# Patient Record
Sex: Female | Born: 1960 | Race: White | Hispanic: No | Marital: Married | Smoking: Current some day smoker
Health system: Southern US, Community
[De-identification: ages and names within clinical notes are randomized; demographics above are authoritative.]

## PROBLEM LIST (undated history)

## (undated) DIAGNOSIS — N939 Abnormal uterine and vaginal bleeding, unspecified: Secondary | ICD-10-CM

## (undated) DIAGNOSIS — M1990 Unspecified osteoarthritis, unspecified site: Secondary | ICD-10-CM

## (undated) DIAGNOSIS — Z9889 Other specified postprocedural states: Secondary | ICD-10-CM

## (undated) DIAGNOSIS — E785 Hyperlipidemia, unspecified: Secondary | ICD-10-CM

## (undated) DIAGNOSIS — S2220XA Unspecified fracture of sternum, initial encounter for closed fracture: Secondary | ICD-10-CM

## (undated) DIAGNOSIS — Z8489 Family history of other specified conditions: Secondary | ICD-10-CM

## (undated) DIAGNOSIS — M4850XA Collapsed vertebra, not elsewhere classified, site unspecified, initial encounter for fracture: Secondary | ICD-10-CM

## (undated) DIAGNOSIS — Z8619 Personal history of other infectious and parasitic diseases: Secondary | ICD-10-CM

## (undated) DIAGNOSIS — J939 Pneumothorax, unspecified: Secondary | ICD-10-CM

## (undated) DIAGNOSIS — R112 Nausea with vomiting, unspecified: Secondary | ICD-10-CM

## (undated) DIAGNOSIS — S42009A Fracture of unspecified part of unspecified clavicle, initial encounter for closed fracture: Secondary | ICD-10-CM

## (undated) HISTORY — DX: Hyperlipidemia, unspecified: E78.5

## (undated) HISTORY — DX: Personal history of other infectious and parasitic diseases: Z86.19

## (undated) HISTORY — PX: TUBAL LIGATION: SHX77

## (undated) HISTORY — PX: KNEE ARTHROSCOPY W/ ACL RECONSTRUCTION: SHX1858

## (undated) HISTORY — DX: Abnormal uterine and vaginal bleeding, unspecified: N93.9

---

## 2003-03-02 HISTORY — PX: BREAST BIOPSY: SHX20

## 2010-08-27 ENCOUNTER — Other Ambulatory Visit (HOSPITAL_COMMUNITY)
Admission: RE | Admit: 2010-08-27 | Discharge: 2010-08-27 | Disposition: A | Discharge status: Home / Self Care | Payer: BC Managed Care – PPO | Source: Ambulatory Visit | Attending: Internal Medicine | Admitting: Internal Medicine

## 2010-08-27 ENCOUNTER — Other Ambulatory Visit: Payer: Self-pay | Admitting: Family Medicine

## 2010-08-27 DIAGNOSIS — Z1231 Encounter for screening mammogram for malignant neoplasm of breast: Secondary | ICD-10-CM

## 2010-08-27 DIAGNOSIS — Z01419 Encounter for gynecological examination (general) (routine) without abnormal findings: Secondary | ICD-10-CM | POA: Insufficient documentation

## 2010-10-01 ENCOUNTER — Ambulatory Visit
Admission: RE | Admit: 2010-10-01 | Discharge: 2010-10-01 | Disposition: A | Discharge status: Home / Self Care | Payer: BC Managed Care – PPO | Source: Ambulatory Visit | Attending: Family Medicine | Admitting: Family Medicine

## 2010-10-01 DIAGNOSIS — Z1231 Encounter for screening mammogram for malignant neoplasm of breast: Secondary | ICD-10-CM

## 2010-10-02 ENCOUNTER — Other Ambulatory Visit: Payer: Self-pay | Admitting: Family Medicine

## 2010-10-02 DIAGNOSIS — N644 Mastodynia: Secondary | ICD-10-CM

## 2010-10-20 ENCOUNTER — Ambulatory Visit
Admission: RE | Admit: 2010-10-20 | Discharge: 2010-10-20 | Disposition: A | Discharge status: Home / Self Care | Payer: BC Managed Care – PPO | Source: Ambulatory Visit | Attending: Family Medicine | Admitting: Family Medicine

## 2010-10-20 ENCOUNTER — Other Ambulatory Visit: Payer: Self-pay | Admitting: Family Medicine

## 2010-10-20 DIAGNOSIS — N644 Mastodynia: Secondary | ICD-10-CM

## 2014-05-08 ENCOUNTER — Encounter (HOSPITAL_COMMUNITY): Payer: Self-pay | Admitting: *Deleted

## 2014-05-09 ENCOUNTER — Ambulatory Visit (HOSPITAL_COMMUNITY): Payer: BLUE CROSS/BLUE SHIELD

## 2014-05-09 ENCOUNTER — Encounter (HOSPITAL_COMMUNITY): Payer: Self-pay | Admitting: *Deleted

## 2014-05-09 ENCOUNTER — Ambulatory Visit (HOSPITAL_COMMUNITY)
Admission: RE | Admit: 2014-05-09 | Discharge: 2014-05-09 | Disposition: A | Discharge status: Home / Self Care | Payer: BLUE CROSS/BLUE SHIELD | Source: Ambulatory Visit | Attending: Orthopedic Surgery | Admitting: Orthopedic Surgery

## 2014-05-09 ENCOUNTER — Ambulatory Visit (HOSPITAL_COMMUNITY): Payer: BLUE CROSS/BLUE SHIELD | Admitting: Certified Registered"

## 2014-05-09 ENCOUNTER — Encounter (HOSPITAL_COMMUNITY)
Admission: RE | Disposition: A | Discharge status: Home / Self Care | Payer: Self-pay | Source: Ambulatory Visit | Attending: Orthopedic Surgery

## 2014-05-09 DIAGNOSIS — Z886 Allergy status to analgesic agent status: Secondary | ICD-10-CM | POA: Diagnosis not present

## 2014-05-09 DIAGNOSIS — Z87891 Personal history of nicotine dependence: Secondary | ICD-10-CM | POA: Insufficient documentation

## 2014-05-09 DIAGNOSIS — S42001A Fracture of unspecified part of right clavicle, initial encounter for closed fracture: Secondary | ICD-10-CM | POA: Diagnosis not present

## 2014-05-09 DIAGNOSIS — S42009A Fracture of unspecified part of unspecified clavicle, initial encounter for closed fracture: Secondary | ICD-10-CM

## 2014-05-09 HISTORY — PX: ORIF CLAVICULAR FRACTURE: SHX5055

## 2014-05-09 HISTORY — DX: Unspecified fracture of sternum, initial encounter for closed fracture: S22.20XA

## 2014-05-09 HISTORY — DX: Family history of other specified conditions: Z84.89

## 2014-05-09 HISTORY — DX: Pneumothorax, unspecified: J93.9

## 2014-05-09 HISTORY — DX: Nausea with vomiting, unspecified: R11.2

## 2014-05-09 HISTORY — DX: Unspecified osteoarthritis, unspecified site: M19.90

## 2014-05-09 HISTORY — DX: Fracture of unspecified part of unspecified clavicle, initial encounter for closed fracture: S42.009A

## 2014-05-09 HISTORY — DX: Other specified postprocedural states: Z98.890

## 2014-05-09 HISTORY — DX: Collapsed vertebra, not elsewhere classified, site unspecified, initial encounter for fracture: M48.50XA

## 2014-05-09 LAB — PREGNANCY, URINE: Preg Test, Ur: NEGATIVE

## 2014-05-09 SURGERY — OPEN REDUCTION INTERNAL FIXATION (ORIF) CLAVICULAR FRACTURE
Anesthesia: General | Site: Shoulder | Laterality: Right

## 2014-05-09 MED ORDER — FENTANYL CITRATE 0.05 MG/ML IJ SOLN
25.0000 ug | INTRAMUSCULAR | Status: DC | PRN
  Administered 2014-05-09 (×2): 50 ug via INTRAVENOUS

## 2014-05-09 MED ORDER — METHOCARBAMOL 500 MG PO TABS
ORAL_TABLET | ORAL | Status: AC
  Filled 2014-05-09: qty 1

## 2014-05-09 MED ORDER — HYDROMORPHONE HCL 1 MG/ML IJ SOLN
INTRAMUSCULAR | Status: AC
  Administered 2014-05-09: 0.5 mg via INTRAVENOUS
  Filled 2014-05-09: qty 1

## 2014-05-09 MED ORDER — METHOCARBAMOL 500 MG PO TABS: 500.0000 mg | ORAL_TABLET | Freq: Three times a day (TID) | ORAL | Status: DC | PRN

## 2014-05-09 MED ORDER — MIDAZOLAM HCL 2 MG/2ML IJ SOLN
INTRAMUSCULAR | Status: AC
  Filled 2014-05-09: qty 2

## 2014-05-09 MED ORDER — KETOROLAC TROMETHAMINE 30 MG/ML IJ SOLN
30.0000 mg | Freq: Once | INTRAMUSCULAR | Status: AC | PRN
  Administered 2014-05-09: 30 mg via INTRAVENOUS

## 2014-05-09 MED ORDER — LIDOCAINE HCL (CARDIAC) 20 MG/ML IV SOLN
INTRAVENOUS | Status: DC | PRN
  Administered 2014-05-09: 50 mg via INTRAVENOUS

## 2014-05-09 MED ORDER — HYDROMORPHONE HCL 1 MG/ML IJ SOLN
INTRAMUSCULAR | Status: AC
  Administered 2014-05-09: 0.5 mg via INTRAVENOUS
  Filled 2014-05-09: qty 2

## 2014-05-09 MED ORDER — MIDAZOLAM HCL 5 MG/5ML IJ SOLN
INTRAMUSCULAR | Status: DC | PRN
  Administered 2014-05-09: 2 mg via INTRAVENOUS

## 2014-05-09 MED ORDER — GLYCOPYRROLATE 0.2 MG/ML IJ SOLN
INTRAMUSCULAR | Status: DC | PRN
  Administered 2014-05-09: 0.2 mg via INTRAVENOUS

## 2014-05-09 MED ORDER — ROCURONIUM BROMIDE 50 MG/5ML IV SOLN
INTRAVENOUS | Status: AC
  Filled 2014-05-09: qty 1

## 2014-05-09 MED ORDER — 0.9 % SODIUM CHLORIDE (POUR BTL) OPTIME
TOPICAL | Status: DC | PRN
  Administered 2014-05-09: 1000 mL

## 2014-05-09 MED ORDER — BUPIVACAINE HCL (PF) 0.25 % IJ SOLN
INTRAMUSCULAR | Status: AC
  Filled 2014-05-09: qty 30

## 2014-05-09 MED ORDER — HYDROMORPHONE HCL 1 MG/ML IJ SOLN
0.2500 mg | INTRAMUSCULAR | Status: DC | PRN
  Administered 2014-05-09 (×4): 0.5 mg via INTRAVENOUS

## 2014-05-09 MED ORDER — HYDROMORPHONE HCL 1 MG/ML IJ SOLN
INTRAMUSCULAR | Status: AC
  Filled 2014-05-09: qty 1

## 2014-05-09 MED ORDER — LACTATED RINGERS IV SOLN
INTRAVENOUS | Status: DC
  Administered 2014-05-09 (×2): via INTRAVENOUS

## 2014-05-09 MED ORDER — METHOCARBAMOL 500 MG PO TABS
500.0000 mg | ORAL_TABLET | Freq: Once | ORAL | Status: AC
  Administered 2014-05-09: 500 mg via ORAL

## 2014-05-09 MED ORDER — FENTANYL CITRATE 0.05 MG/ML IJ SOLN
INTRAMUSCULAR | Status: AC
  Filled 2014-05-09: qty 5

## 2014-05-09 MED ORDER — ONDANSETRON HCL 4 MG/2ML IJ SOLN
INTRAMUSCULAR | Status: DC | PRN
  Administered 2014-05-09: 4 mg via INTRAVENOUS

## 2014-05-09 MED ORDER — CHLORHEXIDINE GLUCONATE 4 % EX LIQD: 60.0000 mL | Freq: Once | CUTANEOUS | Status: DC

## 2014-05-09 MED ORDER — LIDOCAINE HCL (CARDIAC) 20 MG/ML IV SOLN
INTRAVENOUS | Status: AC
  Filled 2014-05-09: qty 5

## 2014-05-09 MED ORDER — BUPIVACAINE-EPINEPHRINE (PF) 0.25% -1:200000 IJ SOLN
INTRAMUSCULAR | Status: AC
  Filled 2014-05-09: qty 30

## 2014-05-09 MED ORDER — BUPIVACAINE-EPINEPHRINE 0.25% -1:200000 IJ SOLN
INTRAMUSCULAR | Status: DC | PRN
  Administered 2014-05-09: 7 mL

## 2014-05-09 MED ORDER — FENTANYL CITRATE 0.05 MG/ML IJ SOLN
INTRAMUSCULAR | Status: DC | PRN
  Administered 2014-05-09: 25 ug via INTRAVENOUS
  Administered 2014-05-09: 50 ug via INTRAVENOUS
  Administered 2014-05-09: 25 ug via INTRAVENOUS
  Administered 2014-05-09: 50 ug via INTRAVENOUS
  Administered 2014-05-09: 25 ug via INTRAVENOUS
  Administered 2014-05-09: 100 ug via INTRAVENOUS
  Administered 2014-05-09 (×3): 25 ug via INTRAVENOUS

## 2014-05-09 MED ORDER — GLYCOPYRROLATE 0.2 MG/ML IJ SOLN
INTRAMUSCULAR | Status: AC
  Filled 2014-05-09: qty 1

## 2014-05-09 MED ORDER — FENTANYL CITRATE 0.05 MG/ML IJ SOLN
INTRAMUSCULAR | Status: AC
  Administered 2014-05-09: 50 ug via INTRAVENOUS
  Filled 2014-05-09: qty 2

## 2014-05-09 MED ORDER — DEXAMETHASONE SODIUM PHOSPHATE 4 MG/ML IJ SOLN
INTRAMUSCULAR | Status: DC | PRN
  Administered 2014-05-09: 8 mg via INTRAVENOUS

## 2014-05-09 MED ORDER — DEXAMETHASONE SODIUM PHOSPHATE 4 MG/ML IJ SOLN
INTRAMUSCULAR | Status: AC
  Filled 2014-05-09: qty 1

## 2014-05-09 MED ORDER — PROPOFOL 10 MG/ML IV BOLUS
INTRAVENOUS | Status: AC
  Filled 2014-05-09: qty 20

## 2014-05-09 MED ORDER — SUCCINYLCHOLINE CHLORIDE 20 MG/ML IJ SOLN
INTRAMUSCULAR | Status: DC | PRN
  Administered 2014-05-09: 100 mg via INTRAVENOUS

## 2014-05-09 MED ORDER — PROMETHAZINE HCL 25 MG/ML IJ SOLN: 6.2500 mg | INTRAMUSCULAR | Status: DC | PRN

## 2014-05-09 MED ORDER — PROPOFOL 10 MG/ML IV BOLUS
INTRAVENOUS | Status: DC | PRN
  Administered 2014-05-09: 150 mg via INTRAVENOUS

## 2014-05-09 MED ORDER — KETOROLAC TROMETHAMINE 30 MG/ML IJ SOLN
INTRAMUSCULAR | Status: AC
  Administered 2014-05-09: 30 mg via INTRAVENOUS
  Filled 2014-05-09: qty 1

## 2014-05-09 MED ORDER — PHENYLEPHRINE HCL 10 MG/ML IJ SOLN
10.0000 mg | INTRAVENOUS | Status: DC | PRN
  Administered 2014-05-09: 20 ug/min via INTRAVENOUS

## 2014-05-09 MED ORDER — CEFAZOLIN SODIUM-DEXTROSE 2-3 GM-% IV SOLR
2.0000 g | INTRAVENOUS | Status: AC
  Administered 2014-05-09: 2 g via INTRAVENOUS
  Filled 2014-05-09: qty 50

## 2014-05-09 SURGICAL SUPPLY — 55 items
BIT DRILL 3.2 QC DISP (BIT) ×3 IMPLANT
BIT DRILL Q COUPLING 4.5 (BIT) IMPLANT
BIT DRILL Q/COUPLING 1 (BIT) IMPLANT
CLEANER TIP ELECTROSURG 2X2 (MISCELLANEOUS) ×3 IMPLANT
CLOSURE WOUND 1/2 X4 (GAUZE/BANDAGES/DRESSINGS) ×1
DRAPE C-ARM 42X72 X-RAY (DRAPES) ×3 IMPLANT
DRAPE IMP U-DRAPE 54X76 (DRAPES) ×3 IMPLANT
DRAPE INCISE IOBAN 66X45 STRL (DRAPES) ×3 IMPLANT
DRAPE U-SHAPE 47X51 STRL (DRAPES) ×3 IMPLANT
DRSG EMULSION OIL 3X3 NADH (GAUZE/BANDAGES/DRESSINGS) ×3 IMPLANT
DRSG PAD ABDOMINAL 8X10 ST (GAUZE/BANDAGES/DRESSINGS) ×3 IMPLANT
DURAPREP 26ML APPLICATOR (WOUND CARE) ×3 IMPLANT
ELECT NEEDLE TIP 2.8 STRL (NEEDLE) ×3 IMPLANT
ELECT REM PT RETURN 9FT ADLT (ELECTROSURGICAL) ×3
ELECTRODE REM PT RTRN 9FT ADLT (ELECTROSURGICAL) ×1 IMPLANT
GAUZE SPONGE 4X4 12PLY STRL (GAUZE/BANDAGES/DRESSINGS) ×3 IMPLANT
GLOVE BIOGEL PI IND STRL 7.5 (GLOVE) ×2 IMPLANT
GLOVE BIOGEL PI INDICATOR 7.5 (GLOVE) ×4
GLOVE BIOGEL PI ORTHO PRO 7.5 (GLOVE) ×2
GLOVE BIOGEL PI ORTHO PRO SZ8 (GLOVE) ×2
GLOVE ORTHO TXT STRL SZ7.5 (GLOVE) ×3 IMPLANT
GLOVE PI ORTHO PRO STRL 7.5 (GLOVE) ×1 IMPLANT
GLOVE PI ORTHO PRO STRL SZ8 (GLOVE) ×1 IMPLANT
GLOVE SKINSENSE NS SZ7.5 (GLOVE) ×2
GLOVE SKINSENSE STRL SZ7.5 (GLOVE) ×1 IMPLANT
GLOVE SURG ORTHO 8.5 STRL (GLOVE) ×6 IMPLANT
GOWN STRL REUS W/ TWL LRG LVL3 (GOWN DISPOSABLE) ×2 IMPLANT
GOWN STRL REUS W/ TWL XL LVL3 (GOWN DISPOSABLE) ×2 IMPLANT
GOWN STRL REUS W/TWL LRG LVL3 (GOWN DISPOSABLE) ×4
GOWN STRL REUS W/TWL XL LVL3 (GOWN DISPOSABLE) ×4
KIT BASIN OR (CUSTOM PROCEDURE TRAY) ×3 IMPLANT
KIT ROOM TURNOVER OR (KITS) ×3 IMPLANT
MANIFOLD NEPTUNE II (INSTRUMENTS) IMPLANT
NEEDLE HYPO 25GX1X1/2 BEV (NEEDLE) ×3 IMPLANT
NS IRRIG 1000ML POUR BTL (IV SOLUTION) ×3 IMPLANT
PACK SHOULDER (CUSTOM PROCEDURE TRAY) ×3 IMPLANT
PACK UNIVERSAL I (CUSTOM PROCEDURE TRAY) ×3 IMPLANT
PAD ARMBOARD 7.5X6 YLW CONV (MISCELLANEOUS) ×6 IMPLANT
PIN CLAVICLE ASSEMBLY 3.0MM (Pin) ×3 IMPLANT
SLING ARM IMMOBILIZER LRG (SOFTGOODS) ×3 IMPLANT
SLING ARM LRG ADULT FOAM STRAP (SOFTGOODS) ×3 IMPLANT
SPONGE LAP 4X18 X RAY DECT (DISPOSABLE) ×3 IMPLANT
STRIP CLOSURE SKIN 1/2X4 (GAUZE/BANDAGES/DRESSINGS) ×2 IMPLANT
SUCTION FRAZIER TIP 10 FR DISP (SUCTIONS) ×3 IMPLANT
SUT FIBERWIRE #2 38 T-5 BLUE (SUTURE) ×6
SUT MNCRL AB 4-0 PS2 18 (SUTURE) ×3 IMPLANT
SUT VIC AB 2-0 CT1 27 (SUTURE) ×4
SUT VIC AB 2-0 CT1 TAPERPNT 27 (SUTURE) ×2 IMPLANT
SUT VICRYL 0 CT 1 36IN (SUTURE) ×6 IMPLANT
SUTURE FIBERWR #2 38 T-5 BLUE (SUTURE) ×2 IMPLANT
SYR CONTROL 10ML LL (SYRINGE) ×3 IMPLANT
TAPE CLOTH SURG 6X10 WHT LF (GAUZE/BANDAGES/DRESSINGS) ×3 IMPLANT
TOWEL OR 17X24 6PK STRL BLUE (TOWEL DISPOSABLE) ×3 IMPLANT
TOWEL OR 17X26 10 PK STRL BLUE (TOWEL DISPOSABLE) ×3 IMPLANT
WATER STERILE IRR 1000ML POUR (IV SOLUTION) ×3 IMPLANT

## 2014-05-09 NOTE — Interval H&P Note (Signed)
History and Physical Interval Note:  05/09/2014 8:19 PM  Madison Watts  has presented today for surgery, with the diagnosis of right clavicle fracture  The various methods of treatment have been discussed with the patient and family. After consideration of risks, benefits and other options for treatment, the patient has consented to  Procedure(s): OPEN REDUCTION INTERNAL FIXATION (ORIF) RIGHT CLAVICULAR FRACTURE (Right) as a surgical intervention .  The patient's history has been reviewed, patient examined, no change in status, stable for surgery.  I have reviewed the patient's chart and labs.  Questions were answered to the patient's satisfaction.     Juliauna Stueve,STEVEN R
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
## 2014-05-09 NOTE — Transfer of Care (Signed)
Immediate Anesthesia Transfer of Care Note  Patient: Madison Watts  Procedure(s) Performed: Procedure(s): OPEN REDUCTION INTERNAL FIXATION (ORIF) RIGHT CLAVICULAR FRACTURE (Right)  Patient Location: PACU  Anesthesia Type:General  Level of Consciousness: awake, alert , oriented and patient cooperative  Airway & Oxygen Therapy: Patient Spontanous Breathing and Patient connected to nasal cannula oxygen  Post-op Assessment: Report given to RN, Post -op Vital signs reviewed and stable and Patient moving all extremities  Post vital signs: Reviewed and stable  Last Vitals:  Filed Vitals:   05/09/14 1706  BP: 158/87  Pulse: 78  Temp: 36.6 C  Resp: 18    Complications: No apparent anesthesia complications

## 2014-05-09 NOTE — Anesthesia Preprocedure Evaluation (Addendum)
Anesthesia Evaluation  Patient identified by MRN, date of birth, ID band Patient awake    Reviewed: Allergy & Precautions, NPO status , Patient's Chart, lab work & pertinent test results  History of Anesthesia Complications (+) PONV  Airway Mallampati: II  TM Distance: >3 FB Neck ROM: Full    Dental no notable dental hx.    Pulmonary neg pulmonary ROS, former smoker,  Patient has small pneumothorax breath sounds clear to auscultation  Pulmonary exam normal       Cardiovascular negative cardio ROS  Rhythm:Regular Rate:Normal     Neuro/Psych negative neurological ROS  negative psych ROS   GI/Hepatic negative GI ROS, Neg liver ROS,   Endo/Other  negative endocrine ROS  Renal/GU negative Renal ROS  negative genitourinary   Musculoskeletal negative musculoskeletal ROS (+)   Abdominal   Peds negative pediatric ROS (+)  Hematology negative hematology ROS (+)   Anesthesia Other Findings   Reproductive/Obstetrics negative OB ROS                           Anesthesia Physical Anesthesia Plan  ASA: II  Anesthesia Plan: General   Post-op Pain Management:    Induction: Intravenous  Airway Management Planned: Oral ETT  Additional Equipment:   Intra-op Plan:   Post-operative Plan: Extubation in OR  Informed Consent: I have reviewed the patients History and Physical, chart, labs and discussed the procedure including the risks, benefits and alternatives for the proposed anesthesia with the patient or authorized representative who has indicated his/her understanding and acceptance.   Dental advisory given  Plan Discussed with: CRNA and Surgeon  Anesthesia Plan Comments:        Anesthesia Quick Evaluation

## 2014-05-09 NOTE — Anesthesia Procedure Notes (Signed)
Procedure Name: Intubation Date/Time: 05/09/2014 8:26 PM Performed by: Eligha Bridegroom Pre-anesthesia Checklist: Emergency Drugs available, Patient identified, Timeout performed, Suction available and Patient being monitored Patient Re-evaluated:Patient Re-evaluated prior to inductionOxygen Delivery Method: Circle system utilized Preoxygenation: Pre-oxygenation with 100% oxygen Intubation Type: IV induction Ventilation: Mask ventilation without difficulty Laryngoscope Size: Mac and 4 Grade View: Grade I Tube type: Oral Tube size: 7.0 mm Number of attempts: 1 Airway Equipment and Method: Stylet Placement Confirmation: positive ETCO2,  ETT inserted through vocal cords under direct vision and breath sounds checked- equal and bilateral Secured at: 21 cm Tube secured with: Tape Dental Injury: Teeth and Oropharynx as per pre-operative assessment

## 2014-05-09 NOTE — Discharge Instructions (Signed)
Ice to the shoulder at all times. Wear the sling while up and around.  If seated and the arm supported, can remove the sling gently.  No use of the right arm.  Keep the dressing clean and dry and intact until next Monday, then can remove and place BandAids.  Ok to get wet in the shower next Thursday   General Anesthesia, Care After Refer to this sheet in the next few weeks. These instructions provide you with information on caring for yourself after your procedure. Your health care provider may also give you more specific instructions. Your treatment has been planned according to current medical practices, but problems sometimes occur. Call your health care provider if you have any problems or questions after your procedure. WHAT TO EXPECT AFTER THE PROCEDURE After the procedure, it is typical to experience:  Sleepiness.  Nausea and vomiting. HOME CARE INSTRUCTIONS  For the first 24 hours after general anesthesia:  Have a responsible person with you.  Do not drive a car. If you are alone, do not take public transportation.  Do not drink alcohol.  Do not take medicine that has not been prescribed by your health care provider.  Do not sign important papers or make important decisions.  You may resume a normal diet and activities as directed by your health care provider.  Change bandages (dressings) as directed.  If you have questions or problems that seem related to general anesthesia, call the hospital and ask for the anesthetist or anesthesiologist on call. SEEK MEDICAL CARE IF:  You have nausea and vomiting that continue the day after anesthesia.  You develop a rash. SEEK IMMEDIATE MEDICAL CARE IF:   You have difficulty breathing.  You have chest pain.  You have any allergic problems. Document Released: 05/24/2000 Document Revised: 02/20/2013 Document Reviewed: 08/31/2012 [facility] Patient Information 2015 [place], [place]. This information is not intended to replace  advice given to you by your health care provider. Make sure you discuss any questions you have with your health care provider.
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
## 2014-05-10 NOTE — Anesthesia Postprocedure Evaluation (Signed)
  Anesthesia Post-op Note  Patient: Madison Watts  Procedure(s) Performed: Procedure(s) (LRB): OPEN REDUCTION INTERNAL FIXATION (ORIF) RIGHT CLAVICULAR FRACTURE (Right)  Patient Location: PACU  Anesthesia Type: General  Level of Consciousness: awake and alert   Airway and Oxygen Therapy: Patient Spontanous Breathing  Post-op Pain: mild  Post-op Assessment: Post-op Vital signs reviewed, Patient's Cardiovascular Status Stable, Respiratory Function Stable, Patent Airway and No signs of Nausea or vomiting  Last Vitals:  Filed Vitals:   05/09/14 2300  BP: 168/89  Pulse: 96  Temp: 36.8 C  Resp: 14    Post-op Vital Signs: stable   Complications: No apparent anesthesia complications
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
## 2014-05-10 NOTE — Discharge Summary (Signed)
  Physician Discharge Summary   Patient ID: Madison Watts MRN: [id number] DOB/AGE: 1960/05/10 54 y.o.  Admit date: 05/09/2014 Discharge date: 05/10/2014  Admission Diagnoses:  Active Problems:   * No active hospital problems. *   Discharge Diagnoses:  Same   Surgeries: Procedure(s): OPEN REDUCTION INTERNAL FIXATION (ORIF) RIGHT CLAVICULAR FRACTURE on 05/09/2014   Consultants: none  Discharged Condition: Stable  Hospital Course: Madison Watts is an 54 y.o. female who was admitted 05/09/2014 with a chief complaint of right shoulder pain, and found to have a diagnosis of right displaced clavicle fracture.  They were brought to the operating room on 05/09/2014 and underwent the above named procedures.    The patient had an uncomplicated hospital course and was stable for discharge.  Recent vital signs:  Filed Vitals:   05/09/14 2300  BP: 168/89  Pulse: 96  Temp: 98.2 F (36.8 C)  Resp: 14    Recent laboratory studies:  Results for orders placed or performed during the hospital encounter of 05/09/14  Pregnancy, urine  Result Value Ref Range   Preg Test, Ur NEGATIVE NEGATIVE    Discharge Medications:     Medication List    TAKE these medications        methocarbamol 500 MG tablet  Commonly known as:  ROBAXIN  Take 1 tablet (500 mg total) by mouth 3 (three) times daily as needed.     OxyCODONE 10 mg T12a 12 hr tablet  Commonly known as:  OXYCONTIN  Take 10 mg by mouth 2 (two) times daily as needed (for pain).     sennosides-docusate sodium 8.6-50 MG tablet  Commonly known as:  SENOKOT-S  Take 2 tablets by mouth 2 (two) times daily.        Diagnostic Studies: Dg Clavicle Right  05/09/2014   CLINICAL DATA:  Right clavicle fracture ORIF.  EXAM: DG C-ARM 61-120 MIN; RIGHT CLAVICLE - 2+ VIEWS  COMPARISON:  None.  FLUOROSCOPY TIME:  C-arm fluoroscopic images were obtained intraoperatively and submitted for post operative interpretation. Please see the performing  provider's procedural report for the fluoroscopy time utilized.  FINDINGS: Two spot fluoroscopic images demonstrate screw fixation of the right clavicle. Underlying bone detail limited by technique and overlapping hardware. No apparent complication.  IMPRESSION: Intraoperative views during right clavicle fixation.   Electronically Signed   By: Richardean Sale M.D.   On: 05/09/2014 22:52   Dg C-arm 1-60 Min  05/09/2014   CLINICAL DATA:  Right clavicle fracture ORIF.  EXAM: DG C-ARM 61-120 MIN; RIGHT CLAVICLE - 2+ VIEWS  COMPARISON:  None.  FLUOROSCOPY TIME:  C-arm fluoroscopic images were obtained intraoperatively and submitted for post operative interpretation. Please see the performing provider's procedural report for the fluoroscopy time utilized.  FINDINGS: Two spot fluoroscopic images demonstrate screw fixation of the right clavicle. Underlying bone detail limited by technique and overlapping hardware. No apparent complication.  IMPRESSION: Intraoperative views during right clavicle fixation.   Electronically Signed   By: Richardean Sale M.D.   On: 05/09/2014 22:52    Disposition: 01-Home or Self Care        Follow-up Information    Follow up with Auren Valdes,STEVEN R, MD. Call in 2 weeks.   Specialty:  Orthopedic Surgery   Why:  [phone]   Contact information:   [address] [place] [address] [phone]        Signed: Augustin Schooling 05/10/2014, 1:44 AM

## 2014-05-12 ENCOUNTER — Encounter (HOSPITAL_COMMUNITY): Payer: Self-pay | Admitting: Orthopedic Surgery

## 2014-09-12 ENCOUNTER — Telehealth: Payer: Self-pay | Admitting: Family Medicine
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
## 2014-09-13 NOTE — Telephone Encounter (Signed)
Husband states pt has stepped out and pls call back Monday  Do you mind giving her a call on Monday? Thanks!

## 2014-09-13 NOTE — Telephone Encounter (Signed)
Ok. In available 30 minute slot if possible. Thanks.

## 2014-09-26 ENCOUNTER — Ambulatory Visit (INDEPENDENT_AMBULATORY_CARE_PROVIDER_SITE_OTHER): Payer: BLUE CROSS/BLUE SHIELD | Admitting: Family Medicine

## 2014-09-26 ENCOUNTER — Encounter: Payer: Self-pay | Admitting: Family Medicine

## 2014-09-26 VITALS — BP 120/88 | HR 83 | Temp 98.3°F | Ht 68.0 in | Wt 202.1 lb

## 2014-09-26 DIAGNOSIS — Z7689 Persons encountering health services in other specified circumstances: Secondary | ICD-10-CM

## 2014-09-26 DIAGNOSIS — E785 Hyperlipidemia, unspecified: Secondary | ICD-10-CM | POA: Insufficient documentation

## 2014-09-26 DIAGNOSIS — R918 Other nonspecific abnormal finding of lung field: Secondary | ICD-10-CM | POA: Insufficient documentation

## 2014-09-26 DIAGNOSIS — Z7189 Other specified counseling: Secondary | ICD-10-CM | POA: Diagnosis not present

## 2014-09-26 DIAGNOSIS — N9489 Other specified conditions associated with female genital organs and menstrual cycle: Secondary | ICD-10-CM | POA: Diagnosis not present

## 2014-09-26 DIAGNOSIS — N858 Other specified noninflammatory disorders of uterus: Secondary | ICD-10-CM | POA: Insufficient documentation

## 2014-09-26 DIAGNOSIS — IMO0001 Reserved for inherently not codable concepts without codable children: Secondary | ICD-10-CM

## 2014-09-26 DIAGNOSIS — N920 Excessive and frequent menstruation with regular cycle: Secondary | ICD-10-CM | POA: Diagnosis not present

## 2014-09-26 DIAGNOSIS — S42009A Fracture of unspecified part of unspecified clavicle, initial encounter for closed fracture: Secondary | ICD-10-CM | POA: Insufficient documentation

## 2014-09-26 DIAGNOSIS — N8320 Unspecified ovarian cysts: Secondary | ICD-10-CM | POA: Diagnosis not present

## 2014-09-26 DIAGNOSIS — N83209 Unspecified ovarian cyst, unspecified side: Secondary | ICD-10-CM | POA: Insufficient documentation

## 2014-09-26 DIAGNOSIS — S42001A Fracture of unspecified part of right clavicle, initial encounter for closed fracture: Secondary | ICD-10-CM

## 2014-09-26 DIAGNOSIS — S32000A Wedge compression fracture of unspecified lumbar vertebra, initial encounter for closed fracture: Secondary | ICD-10-CM | POA: Insufficient documentation

## 2014-09-26 DIAGNOSIS — Z6830 Body mass index (BMI) 30.0-30.9, adult: Secondary | ICD-10-CM

## 2014-09-26 DIAGNOSIS — R030 Elevated blood-pressure reading, without diagnosis of hypertension: Secondary | ICD-10-CM

## 2014-09-26 NOTE — Patient Instructions (Addendum)
BEFORE YOU LEAVE: -schedule physical exam in 3 months - come fasting  -schedule mammogram  -We placed a referral for you as discussed for the gynecolgist. It usually takes about 1-2 weeks to process and schedule this referral. If you have not heard from [place] regarding this appointment in 2 weeks please contact our office.  -We placed a referral for you as discussed for the CT scan for the lung in 04/2015. It usually takes about 1-2 weeks to process and schedule this referral. If you have not heard from [place] regarding this appointment in 2 weeks please contact our office.  -will keep and eye on the BP - bring home log to physical  We recommend the following healthy lifestyle measures: - eat a healthy diet consisting of lots of vegetables, fruits, beans, nuts, seeds, healthy meats such as white chicken and fish and whole grains.  - avoid fried foods, fast food, processed foods, sodas, red meet and other fattening foods.  - get a least 150 minutes of aerobic exercise per week.
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
## 2014-09-26 NOTE — Progress Notes (Signed)
Pre visit review using our clinic review tool, if applicable. No additional management support is needed unless otherwise documented below in the visit note. 

## 2014-10-02 ENCOUNTER — Other Ambulatory Visit: Payer: Self-pay | Admitting: Family Medicine

## 2014-10-02 DIAGNOSIS — N858 Other specified noninflammatory disorders of uterus: Secondary | ICD-10-CM

## 2014-10-02 DIAGNOSIS — N920 Excessive and frequent menstruation with regular cycle: Secondary | ICD-10-CM

## 2014-10-02 DIAGNOSIS — N83209 Unspecified ovarian cyst, unspecified side: Secondary | ICD-10-CM

## 2014-10-02 NOTE — Progress Notes (Unsigned)
Madison Watts,  Could you please contact [facility] and ask them to fax copy of CT of abd/pelvis from February visit? Looks like still not showing up in scanned documents.  Also, please let pt know. I sent a referral to gyn for the ovarian cyst and uterine mass they saw on the ct and the heavy menstrual bleeding. They would like for her to have an [place] prior to that appt - I placed orders for this.   Please ensure debbie is aware and have her set up for the pt and let gyn office know date and time. Thanks!

## 2014-10-03 NOTE — Progress Notes (Signed)
I called the pt and informed her of the message below and she is aware to expect a call with appt info for the gynecologist and ultrasound.  A copy of the CT was given to Neoma Laming to fax to the gynecologist's office.

## 2014-10-07 ENCOUNTER — Other Ambulatory Visit: Payer: Self-pay | Admitting: Family Medicine

## 2014-10-07 DIAGNOSIS — N858 Other specified noninflammatory disorders of uterus: Secondary | ICD-10-CM

## 2014-10-07 DIAGNOSIS — N83209 Unspecified ovarian cyst, unspecified side: Secondary | ICD-10-CM

## 2014-10-07 DIAGNOSIS — N920 Excessive and frequent menstruation with regular cycle: Secondary | ICD-10-CM

## 2014-10-08 ENCOUNTER — Encounter: Payer: Self-pay | Admitting: Obstetrics & Gynecology

## 2014-10-14 ENCOUNTER — Ambulatory Visit
Admission: RE | Admit: 2014-10-14 | Discharge: 2014-10-14 | Disposition: A | Discharge status: Home / Self Care | Payer: BLUE CROSS/BLUE SHIELD | Source: Ambulatory Visit | Attending: Family Medicine | Admitting: Family Medicine

## 2014-10-14 ENCOUNTER — Telehealth: Payer: Self-pay | Admitting: *Deleted

## 2014-10-14 DIAGNOSIS — N920 Excessive and frequent menstruation with regular cycle: Secondary | ICD-10-CM

## 2014-10-14 DIAGNOSIS — N858 Other specified noninflammatory disorders of uterus: Secondary | ICD-10-CM

## 2014-10-14 DIAGNOSIS — N83209 Unspecified ovarian cyst, unspecified side: Secondary | ICD-10-CM

## 2014-10-14 NOTE — Telephone Encounter (Signed)
Madison Watts called from [place] stating the pt is in the office now for testing and the symptoms and diagnoses are for the pelvic area and she needs a diagnosis for the upper abdomen.  I informed Dr Maudie Mercury of this and she stated she does not have any other diagnosis to provide her with as the tests were recommended by the pts gynecologist and Danae Chen was informed of this.

## 2014-10-28 ENCOUNTER — Encounter: Payer: BLUE CROSS/BLUE SHIELD | Admitting: Obstetrics & Gynecology

## 2014-11-08 ENCOUNTER — Encounter: Payer: BLUE CROSS/BLUE SHIELD | Admitting: Obstetrics & Gynecology

## 2014-11-15 ENCOUNTER — Ambulatory Visit (INDEPENDENT_AMBULATORY_CARE_PROVIDER_SITE_OTHER): Payer: BLUE CROSS/BLUE SHIELD | Admitting: Obstetrics & Gynecology

## 2014-11-15 ENCOUNTER — Encounter: Payer: Self-pay | Admitting: Obstetrics & Gynecology

## 2014-11-15 ENCOUNTER — Other Ambulatory Visit (HOSPITAL_COMMUNITY)
Admission: RE | Admit: 2014-11-15 | Discharge: 2014-11-15 | Disposition: A | Discharge status: Home / Self Care | Payer: BLUE CROSS/BLUE SHIELD | Source: Ambulatory Visit | Attending: Obstetrics & Gynecology | Admitting: Obstetrics & Gynecology

## 2014-11-15 VITALS — BP 144/83 | HR 82 | Ht 69.0 in | Wt 204.0 lb

## 2014-11-15 DIAGNOSIS — Z1151 Encounter for screening for human papillomavirus (HPV): Secondary | ICD-10-CM | POA: Diagnosis not present

## 2014-11-15 DIAGNOSIS — Z01419 Encounter for gynecological examination (general) (routine) without abnormal findings: Secondary | ICD-10-CM

## 2014-11-15 DIAGNOSIS — Z124 Encounter for screening for malignant neoplasm of cervix: Secondary | ICD-10-CM

## 2014-11-15 DIAGNOSIS — N939 Abnormal uterine and vaginal bleeding, unspecified: Secondary | ICD-10-CM | POA: Insufficient documentation

## 2014-11-15 DIAGNOSIS — Z23 Encounter for immunization: Secondary | ICD-10-CM

## 2014-11-15 DIAGNOSIS — D259 Leiomyoma of uterus, unspecified: Secondary | ICD-10-CM

## 2014-11-15 DIAGNOSIS — N921 Excessive and frequent menstruation with irregular cycle: Secondary | ICD-10-CM

## 2014-11-15 DIAGNOSIS — Z1239 Encounter for other screening for malignant neoplasm of breast: Secondary | ICD-10-CM

## 2014-11-15 LAB — TSH: TSH: 1.447 u[IU]/mL (ref 0.350–4.500)

## 2014-11-15 NOTE — Patient Instructions (Signed)
Endometrial Biopsy, Care After Refer to this sheet in the next few weeks. These instructions provide you with information on caring for yourself after your procedure. Your health care provider may also give you more specific instructions. Your treatment has been planned according to current medical practices, but problems sometimes occur. Call your health care provider if you have any problems or questions after your procedure. WHAT TO EXPECT AFTER THE PROCEDURE After your procedure, it is typical to have the following:  You may have mild cramping and a small amount of vaginal bleeding for a few days after the procedure. This is normal. HOME CARE INSTRUCTIONS  Only take over-the-counter or prescription medicine as directed by your health care provider.  Do not douche, use tampons, or have sexual intercourse until your health care provider approves.  Follow your health care provider's instructions regarding any activity restrictions, such as strenuous exercise or heavy lifting. SEEK MEDICAL CARE IF:  You have heavy bleeding or bleeding longer than 2 days after the procedure.  You have bad smelling drainage from your vagina.  You have a fever and chills.  Youhave severe lower stomach (abdominal) pain. SEEK IMMEDIATE MEDICAL CARE IF:  You have severe cramps in your stomach or back.  You pass large blood clots.  Your bleeding increases.  You become weak or lightheaded, or you pass out. Document Released: 12/06/2012 Document Reviewed: 12/06/2012 ExitCare Patient Information 2015 [employer]. This information is not intended to replace advice given to you by your health care provider. Make sure you discuss any questions you have with your health care provider.  

## 2014-11-15 NOTE — Progress Notes (Signed)
Patient ID: Madison Watts, female   DOB: 1960/04/17, 54 y.o.   MRN: [id number] History:  54 y.o. G3P3003 here today for eval of AUB. Last PAP 4 years.  No h/o irreg PAPs.  She reports that the bleeding is very heavy and occurs q 2 -3 weeks.  She reports that it is disruptive to her life.  She reports clots as well as heavy bleeding.  The following portions of the patient's history were reviewed and updated as appropriate: allergies, current medications, past family history, past medical history, past social history, past surgical history and problem list.  Review of Systems:  Pertinent items are noted in HPI.  Objective:  Physical Exam Blood pressure 144/83, pulse 82, height 5\' 9"  (1.753 m), weight 204 lb (92.534 kg), last menstrual period 11/02/2014. Gen: NAD Abd: Soft, nontender and nondistended Pelvic: Normal appearing external genitalia; normal appearing vaginal mucosa and cervix.  Large amount of blood in vagina. lNormal discharge.  Small uterus, no other palpable masses, no uterine or adnexal tenderness  The indications for endometrial biopsy were reviewed.   Risks of the biopsy including cramping, bleeding, infection, uterine perforation, inadequate specimen and need for additional procedures  were discussed. The patient states she understands and agrees to undergo procedure today. Consent was signed. Time out was performed. Urine HCG was negative. A sterile speculum was placed in the patient's vagina and the cervix was prepped with Betadine. A single-toothed tenaculum was placed on the anterior lip of the cervix to stabilize it. The 3 mm pipelle was introduced into the endometrial cavity without difficulty to a depth of 8cm, and a moderate amount of tissue was obtained and sent to pathology. The instruments were removed from the patient's vagina. Minimal bleeding from the cervix was noted.    Labs and Imaging 10/14/2014 CLINICAL DATA: Ovarian cyst. Fibroids.  EXAM: TRANSABDOMINAL AND  TRANSVAGINAL ULTRASOUND OF PELVIS  TECHNIQUE: Both transabdominal and transvaginal ultrasound examinations of the pelvis were performed. Transabdominal technique was performed for global imaging of the pelvis including uterus, ovaries, adnexal regions, and pelvic cul-de-sac. It was necessary to proceed with endovaginal exam following the transabdominal exam to visualize the uterus and ovaries.  COMPARISON: None  FINDINGS: Uterus  Measurements: 9.8 x 5.7 x 7.5 cm. 2.4 x 2.1 x 3.0 intramural fibroid right uterine body. 2.3 x 2.2 x 2.0 cm subserosal fibroid left uterine body.  Endometrium  Thickness: 13.2 mm. No focal abnormality visualized.  Right ovary  Measurements: 3.9 x 2.5 x 3.6 cm. 3.0 x 2.0 x 3.0 cm simple cyst.  Left ovary  Measurements: 2.4 x 1.8 x 1.6 cm. 1.5 x 1.1 x 1.4 cm simple cyst.  Other findings  No free fluid.  IMPRESSION: 1. Uterine fibroids.  2. Bilateral simple ovarian cysts. Largest measures 3 cm and is in the right ovary. This is almost certainly benign, and no specific imaging follow up is recommended according to the Society of Radiologists in [place] Statement (D Clovis Riley et al. Management of Asymptomatic Ovarian and Other Adnexal Cysts Imaged at [place]: Society of Radiologists in [place] Statement 2010. Radiology 256 (Sept 2010): 943-954.).   Assessment & Plan:  AUB- suspect perimenopause.   S/p endometrial bx today F/u surg path Megace 40mg  bid F/u in 3 months or sooner prn bleeding despite Megace Reviewed with pt endometrial ablation if bleeding persists  Carolyn L. Harraway-Smith, M.D., Cherlynn June

## 2014-11-16 ENCOUNTER — Other Ambulatory Visit: Payer: Self-pay | Admitting: Obstetrics & Gynecology

## 2014-11-16 LAB — CBC WITH DIFFERENTIAL/PLATELET
BASOS ABS: 0 10*3/uL (ref 0.0–0.1)
Basophils Relative: 0 % (ref 0–1)
EOS ABS: 0.1 10*3/uL (ref 0.0–0.7)
EOS PCT: 1 % (ref 0–5)
HCT: 39.2 % (ref 36.0–46.0)
Hemoglobin: 12.8 g/dL (ref 12.0–15.0)
Lymphocytes Relative: 12 % (ref 12–46)
Lymphs Abs: 1.1 10*3/uL (ref 0.7–4.0)
MCH: 30 pg (ref 26.0–34.0)
MCHC: 32.7 g/dL (ref 30.0–36.0)
MCV: 92 fL (ref 78.0–100.0)
MPV: 9.2 fL (ref 8.6–12.4)
Monocytes Absolute: 0.8 10*3/uL (ref 0.1–1.0)
Monocytes Relative: 8 % (ref 3–12)
NEUTROS PCT: 79 % — AB (ref 43–77)
Neutro Abs: 7.4 10*3/uL (ref 1.7–7.7)
Platelets: 401 10*3/uL — ABNORMAL HIGH (ref 150–400)
RBC: 4.26 MIL/uL (ref 3.87–5.11)
RDW: 14.8 % (ref 11.5–15.5)
WBC: 9.4 10*3/uL (ref 4.0–10.5)

## 2014-11-16 MED ORDER — MEGESTROL ACETATE 40 MG PO TABS: ORAL_TABLET | ORAL | Status: DC

## 2014-11-16 NOTE — Progress Notes (Signed)
Pt called the after hours line and said her progesterone has not been called into the pharmacy.  She saw Dr. Ihor Dow on 11/15/14.  Pt is not anemic and does not need iron.   The note from 11/15/14 is not complete (no assessment or plan).    TVUS shows   TECHNIQUE: Both transabdominal and transvaginal ultrasound examinations of the pelvis were performed. Transabdominal technique was performed for global imaging of the pelvis including uterus, ovaries, adnexal regions, and pelvic cul-de-sac. It was necessary to proceed with endovaginal exam following the transabdominal exam to visualize the uterus and ovaries.  COMPARISON: None  FINDINGS: Uterus  Measurements: 9.8 x 5.7 x 7.5 cm. 2.4 x 2.1 x 3.0 intramural fibroid right uterine body. 2.3 x 2.2 x 2.0 cm subserosal fibroid left uterine body.  Endometrium  Thickness: 13.2 mm. No focal abnormality visualized.  Right ovary  Measurements: 3.9 x 2.5 x 3.6 cm. 3.0 x 2.0 x 3.0 cm simple cyst.  Left ovary  Measurements: 2.4 x 1.8 x 1.6 cm. 1.5 x 1.1 x 1.4 cm simple cyst.  Other findings  No free fluid.  IMPRESSION: 1. Uterine fibroids.  2. Bilateral simple ovarian cysts. Largest measures 3 cm and is in the right ovary. This is almost certainly benign, and no specific imaging follow up is recommended according to the Society of Radiologists in [place] Statement (D Clovis Riley et al. Management of Asymptomatic Ovarian and Other Adnexal Cysts Imaged at [place]: Society of Radiologists in [place] Statement 2010. Radiology 256 (Sept 2010): 845-364.).   Will call in Megace and refer chart to Dr. Ihor Dow to see if she wants change the plan.

## 2014-11-18 LAB — CYTOLOGY - PAP

## 2014-11-18 NOTE — Progress Notes (Signed)
Awaiting Dr Ihor Dow advisement.

## 2014-11-19 ENCOUNTER — Other Ambulatory Visit: Payer: Self-pay | Admitting: Obstetrics & Gynecology

## 2014-11-19 ENCOUNTER — Encounter: Payer: Self-pay | Admitting: Obstetrics & Gynecology

## 2014-11-19 DIAGNOSIS — N939 Abnormal uterine and vaginal bleeding, unspecified: Secondary | ICD-10-CM

## 2014-11-19 MED ORDER — MEGESTROL ACETATE 40 MG PO TABS: 40.0000 mg | ORAL_TABLET | Freq: Two times a day (BID) | ORAL | Status: DC

## 2014-11-19 NOTE — Progress Notes (Signed)
Called patient and informed her of results, medication, and recommendation. Patient verbalized understanding to all and had no questions

## 2014-11-19 NOTE — Progress Notes (Signed)
Per Dr Ihor Dow, all patient's lab work is normal including biopsies. Patient needs to take Megace 40mg  bid x 2 weeks then 40mg  daily until 3 month f/u visit.

## 2014-11-20 ENCOUNTER — Other Ambulatory Visit: Payer: Self-pay | Admitting: Obstetrics & Gynecology

## 2014-11-20 ENCOUNTER — Ambulatory Visit (HOSPITAL_COMMUNITY)
Admission: RE | Admit: 2014-11-20 | Discharge: 2014-11-20 | Disposition: A | Discharge status: Home / Self Care | Payer: BLUE CROSS/BLUE SHIELD | Source: Ambulatory Visit | Attending: Obstetrics & Gynecology | Admitting: Obstetrics & Gynecology

## 2014-11-20 DIAGNOSIS — Z1239 Encounter for other screening for malignant neoplasm of breast: Secondary | ICD-10-CM

## 2014-11-20 DIAGNOSIS — Z1231 Encounter for screening mammogram for malignant neoplasm of breast: Secondary | ICD-10-CM | POA: Insufficient documentation

## 2014-11-25 ENCOUNTER — Ambulatory Visit (HOSPITAL_COMMUNITY): Payer: BLUE CROSS/BLUE SHIELD

## 2014-12-18 ENCOUNTER — Telehealth: Payer: Self-pay | Admitting: *Deleted

## 2014-12-18 NOTE — Telephone Encounter (Signed)
Spoke with patient via phone.  States since finishing her megace she has had daily bleeding.  Would like to come in to discuss ablation.  Appointment given for 12/30/14 at 1245 with Dr. Elly Modena.

## 2014-12-18 NOTE — Telephone Encounter (Signed)
Received message left on nurse line on 12/18/14 at 1329.  Patient states she is having heavy bleeding since stopping megace.  Wants to know what she should do - should she come in to discuss options.  Requests a return call.

## 2014-12-30 ENCOUNTER — Ambulatory Visit (INDEPENDENT_AMBULATORY_CARE_PROVIDER_SITE_OTHER): Payer: BLUE CROSS/BLUE SHIELD | Admitting: Obstetrics and Gynecology

## 2014-12-30 ENCOUNTER — Encounter: Payer: Self-pay | Admitting: Obstetrics and Gynecology

## 2014-12-30 VITALS — BP 150/88 | HR 77 | Temp 98.5°F | Resp 16 | Ht 69.0 in | Wt 205.7 lb

## 2014-12-30 DIAGNOSIS — N939 Abnormal uterine and vaginal bleeding, unspecified: Secondary | ICD-10-CM

## 2014-12-30 DIAGNOSIS — N938 Other specified abnormal uterine and vaginal bleeding: Secondary | ICD-10-CM | POA: Diagnosis not present

## 2014-12-30 MED ORDER — MEGESTROL ACETATE 40 MG PO TABS: 40.0000 mg | ORAL_TABLET | Freq: Two times a day (BID) | ORAL | Status: DC

## 2014-12-30 NOTE — Patient Instructions (Signed)
Endometrial Ablation °Endometrial ablation removes the lining of the uterus (endometrium). It is usually a same-day, outpatient treatment. Ablation helps avoid major surgery, such as surgery to remove the cervix and uterus (hysterectomy). After endometrial ablation, you will have little or no menstrual bleeding and may not be able to have children. However, if you are premenopausal, you will need to use a reliable method of birth control following the procedure because of the small chance that pregnancy can occur. °There are different reasons to have this procedure. These reasons include: °· Heavy periods. °· Bleeding that is causing anemia. °· Irregular bleeding. °· Bleeding fibroids on the lining inside the uterus if they are smaller than 3 centimeters. °This procedure may not be possible for you if:  °· You want to have children in the future.   °· You have severe cramps with your menstrual period.   °· You have precancerous or cancerous cells in your uterus.   °· You were recently pregnant.   °· You have gone through menopause.   °· You have had major surgery on your uterus, resulting in thinning of the uterine wall. Surgeries may include: °¨ The removal of one or more uterine fibroids (myomectomy). °¨ A cesarean section with a classic (vertical) incision on your uterus. Ask your health care provider what type of cesarean you had. Sometimes the scar on your skin is different than the scar on your uterus. °Even if you have had surgery on your uterus, certain types of ablation may still be safe for you. Talk with your health care provider. °LET YOUR HEALTH CARE PROVIDER KNOW ABOUT: °· Any allergies you have. °· All medicines you are taking, including vitamins, herbs, eye drops, creams, and over-the-counter medicines. °· Previous problems you or members of your family have had with the use of anesthetics. °· Any blood disorders you have. °· Previous surgeries you have had. °· Medical conditions you have. °RISKS AND  COMPLICATIONS  °Generally, this is a safe procedure. However, as with any procedure, complications can occur. Possible complications include: °· Perforation of the uterus. °· Bleeding. °· Infection of the uterus, bladder, or vagina. °· Injury to surrounding organs. °· An air bubble to the lung (air embolus). °· Pregnancy following the procedure. °· Failure of the procedure to help the problem, requiring hysterectomy. °· Decreased ability to diagnose cancer in the lining of the uterus. °BEFORE THE PROCEDURE °· The lining of the uterus must be tested to make sure there is no pre-cancerous or cancer cells present. °· An ultrasound may be performed to look at the size of the uterus and to check for abnormalities. °· Medicines may be given to thin the lining of the uterus. °PROCEDURE  °During the procedure, your health care provider will use a tool called a resectoscope to help see inside your uterus. There are different ways to remove the lining of your uterus.  °· Radiofrequency - This method uses a radiofrequency-alternating electric current to remove the lining of the uterus. °· Cryotherapy - This method uses extreme cold to freeze the lining of the uterus. °· Heated-Free Liquid - This method uses heated salt (saline) solution to remove the lining of the uterus. °· Microwave - This method uses high-energy microwaves to heat up the lining of the uterus to remove it. °· Thermal balloon - This method involves inserting a catheter with a balloon tip into the uterus. The balloon tip is filled with heated fluid to remove the lining of the uterus. °AFTER THE PROCEDURE  °After your procedure, do   not have sexual intercourse or insert anything into your vagina until permitted by your health care provider. After the procedure, you may experience: °· Cramps. °· Vaginal discharge. °· Frequent urination. °  °This information is not intended to replace advice given to you by your health care provider. Make sure you discuss any  questions you have with your health care provider. °  °Document Released: 12/26/2003 Document Revised: 11/06/2014 Document Reviewed: 07/19/2012 °Elsevier Interactive Patient Education ©2016 Elsevier Inc. ° °
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
## 2015-01-01 ENCOUNTER — Encounter (HOSPITAL_COMMUNITY): Payer: Self-pay | Admitting: *Deleted

## 2015-01-01 ENCOUNTER — Encounter: Payer: Self-pay | Admitting: *Deleted

## 2015-01-21 ENCOUNTER — Ambulatory Visit (INDEPENDENT_AMBULATORY_CARE_PROVIDER_SITE_OTHER): Payer: BLUE CROSS/BLUE SHIELD | Admitting: Family Medicine

## 2015-01-21 ENCOUNTER — Encounter: Payer: Self-pay | Admitting: Family Medicine

## 2015-01-21 VITALS — BP 124/90 | HR 83 | Temp 98.6°F | Ht 69.25 in | Wt 206.2 lb

## 2015-01-21 DIAGNOSIS — Z0000 Encounter for general adult medical examination without abnormal findings: Secondary | ICD-10-CM

## 2015-01-21 DIAGNOSIS — N938 Other specified abnormal uterine and vaginal bleeding: Secondary | ICD-10-CM

## 2015-01-21 DIAGNOSIS — Z1211 Encounter for screening for malignant neoplasm of colon: Secondary | ICD-10-CM

## 2015-01-21 DIAGNOSIS — E785 Hyperlipidemia, unspecified: Secondary | ICD-10-CM | POA: Diagnosis not present

## 2015-01-21 DIAGNOSIS — F411 Generalized anxiety disorder: Secondary | ICD-10-CM

## 2015-01-21 DIAGNOSIS — R918 Other nonspecific abnormal finding of lung field: Secondary | ICD-10-CM | POA: Diagnosis not present

## 2015-01-21 DIAGNOSIS — F330 Major depressive disorder, recurrent, mild: Secondary | ICD-10-CM

## 2015-01-21 LAB — HDL CHOLESTEROL: HDL: 62.9 mg/dL (ref >39.00)

## 2015-01-21 LAB — CHOLESTEROL, TOTAL: Cholesterol: 202 mg/dL — ABNORMAL HIGH (ref 0–200)

## 2015-01-21 LAB — HEMOGLOBIN A1C: Hgb A1c MFr Bld: 5.1 % (ref 4.6–6.5)
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
## 2015-01-21 NOTE — Progress Notes (Signed)
Pre visit review using our clinic review tool, if applicable. No additional management support is needed unless otherwise documented below in the visit note. 

## 2015-01-21 NOTE — Patient Instructions (Addendum)
BEFORE YOU LEAVE: -labs -schedule yearly physical  Call to schedule appointment with Dr. Glennon Hamilton for help with anxiety and depression. Please follow up with me as well if needed.  Call to schedule skin exam.  -We placed a referral for you as discussed for your colonoscopy. It usually takes about 1-2 weeks to process and schedule this referral. If you have not heard from [place] regarding this appointment in 2 weeks please contact our office.   -We have ordered labs or studies at this visit. It can take up to 1-2 weeks for results and processing. We will contact you with instructions IF your results are abnormal. Normal results will be released to your [facility]. If you have not heard from [place] or can not find your results in [facility] in 2 weeks please contact our office.  We recommend the following healthy lifestyle measures: - eat a healthy whole foods diet consisting of regular small meals composed of vegetables, fruits, beans, nuts, seeds, healthy meats such as white chicken and fish and whole grains.  - avoid sweets, white starchy foods, fried foods, fast food, processed foods, sodas, red meet and other fattening foods.  - get a least 150-300 minutes of aerobic exercise per week.

## 2015-01-27 ENCOUNTER — Encounter: Payer: Self-pay | Admitting: Internal Medicine

## 2015-02-07 ENCOUNTER — Ambulatory Visit
Admission: RE | Admit: 2015-02-07 | Discharge: 2015-02-07 | Disposition: A | Discharge status: Home / Self Care | Payer: BLUE CROSS/BLUE SHIELD | Source: Ambulatory Visit | Attending: Orthopedic Surgery | Admitting: Orthopedic Surgery

## 2015-02-07 ENCOUNTER — Other Ambulatory Visit: Payer: Self-pay | Admitting: Orthopedic Surgery

## 2015-02-07 DIAGNOSIS — S42021D Displaced fracture of shaft of right clavicle, subsequent encounter for fracture with routine healing: Secondary | ICD-10-CM
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
## 2015-02-11 ENCOUNTER — Ambulatory Visit (HOSPITAL_COMMUNITY)
Admission: RE | Admit: 2015-02-11 | Discharge: 2015-02-11 | Disposition: A | Discharge status: Home / Self Care | Payer: BLUE CROSS/BLUE SHIELD | Source: Ambulatory Visit | Attending: Obstetrics and Gynecology | Admitting: Obstetrics and Gynecology

## 2015-02-11 ENCOUNTER — Encounter (HOSPITAL_COMMUNITY): Payer: Self-pay | Admitting: Emergency Medicine

## 2015-02-11 ENCOUNTER — Encounter (HOSPITAL_COMMUNITY)
Admission: RE | Disposition: A | Discharge status: Home / Self Care | Payer: Self-pay | Source: Ambulatory Visit | Attending: Obstetrics and Gynecology

## 2015-02-11 ENCOUNTER — Ambulatory Visit (HOSPITAL_COMMUNITY): Payer: BLUE CROSS/BLUE SHIELD | Admitting: Anesthesiology

## 2015-02-11 DIAGNOSIS — M1990 Unspecified osteoarthritis, unspecified site: Secondary | ICD-10-CM | POA: Diagnosis not present

## 2015-02-11 DIAGNOSIS — F17200 Nicotine dependence, unspecified, uncomplicated: Secondary | ICD-10-CM | POA: Diagnosis not present

## 2015-02-11 DIAGNOSIS — N840 Polyp of corpus uteri: Secondary | ICD-10-CM | POA: Diagnosis not present

## 2015-02-11 DIAGNOSIS — N939 Abnormal uterine and vaginal bleeding, unspecified: Secondary | ICD-10-CM | POA: Insufficient documentation

## 2015-02-11 DIAGNOSIS — E785 Hyperlipidemia, unspecified: Secondary | ICD-10-CM | POA: Insufficient documentation

## 2015-02-11 DIAGNOSIS — N938 Other specified abnormal uterine and vaginal bleeding: Secondary | ICD-10-CM | POA: Diagnosis not present

## 2015-02-11 DIAGNOSIS — Z8249 Family history of ischemic heart disease and other diseases of the circulatory system: Secondary | ICD-10-CM | POA: Diagnosis not present

## 2015-02-11 HISTORY — PX: HYSTEROSCOPY: SHX211

## 2015-02-11 LAB — CBC
HCT: 40.8 % (ref 36.0–46.0)
Hemoglobin: 13.2 g/dL (ref 12.0–15.0)
MCH: 28.8 pg (ref 26.0–34.0)
MCHC: 32.4 g/dL (ref 30.0–36.0)
MCV: 89.1 fL (ref 78.0–100.0)
Platelets: 351 10*3/uL (ref 150–400)
RBC: 4.58 MIL/uL (ref 3.87–5.11)
RDW: 14.8 % (ref 11.5–15.5)
WBC: 7.6 10*3/uL (ref 4.0–10.5)

## 2015-02-11 LAB — PREGNANCY, URINE: PREG TEST UR: NEGATIVE

## 2015-02-11 SURGERY — ABLATION, ENDOMETRIUM, HYSTEROSCOPIC
Anesthesia: General | Site: Vagina

## 2015-02-11 MED ORDER — OXYCODONE-ACETAMINOPHEN 5-325 MG PO TABS: 1.0000 | ORAL_TABLET | ORAL | Status: DC | PRN

## 2015-02-11 MED ORDER — KETOROLAC TROMETHAMINE 30 MG/ML IJ SOLN
INTRAMUSCULAR | Status: DC | PRN
  Administered 2015-02-11: 30 mg via INTRAVENOUS

## 2015-02-11 MED ORDER — PROPOFOL 10 MG/ML IV BOLUS
INTRAVENOUS | Status: DC | PRN
  Administered 2015-02-11: 150 mg via INTRAVENOUS
  Administered 2015-02-11: 50 mg via INTRAVENOUS

## 2015-02-11 MED ORDER — MEPERIDINE HCL 25 MG/ML IJ SOLN
INTRAMUSCULAR | Status: AC
  Filled 2015-02-11: qty 1

## 2015-02-11 MED ORDER — MIDAZOLAM HCL 2 MG/2ML IJ SOLN
INTRAMUSCULAR | Status: AC
  Filled 2015-02-11: qty 2

## 2015-02-11 MED ORDER — FENTANYL CITRATE (PF) 100 MCG/2ML IJ SOLN
INTRAMUSCULAR | Status: AC
  Administered 2015-02-11: 50 ug via INTRAVENOUS
  Filled 2015-02-11: qty 2

## 2015-02-11 MED ORDER — MEPERIDINE HCL 25 MG/ML IJ SOLN
6.2500 mg | INTRAMUSCULAR | Status: DC | PRN
  Administered 2015-02-11: 12.5 mg via INTRAVENOUS

## 2015-02-11 MED ORDER — HYDROCODONE-ACETAMINOPHEN 7.5-325 MG PO TABS: 1.0000 | ORAL_TABLET | Freq: Once | ORAL | Status: DC | PRN

## 2015-02-11 MED ORDER — KETOROLAC TROMETHAMINE 30 MG/ML IJ SOLN
INTRAMUSCULAR | Status: AC
  Filled 2015-02-11: qty 1

## 2015-02-11 MED ORDER — MIDAZOLAM HCL 2 MG/2ML IJ SOLN
INTRAMUSCULAR | Status: DC | PRN
  Administered 2015-02-11: 2 mg via INTRAVENOUS

## 2015-02-11 MED ORDER — CHLOROPROCAINE HCL 1 % IJ SOLN
INTRAMUSCULAR | Status: AC
  Filled 2015-02-11: qty 30

## 2015-02-11 MED ORDER — LIDOCAINE HCL (CARDIAC) 20 MG/ML IV SOLN
INTRAVENOUS | Status: AC
  Filled 2015-02-11: qty 5

## 2015-02-11 MED ORDER — DEXAMETHASONE SODIUM PHOSPHATE 10 MG/ML IJ SOLN
INTRAMUSCULAR | Status: AC
  Filled 2015-02-11: qty 1

## 2015-02-11 MED ORDER — CHLOROPROCAINE HCL 1 % IJ SOLN
INTRAMUSCULAR | Status: DC | PRN
  Administered 2015-02-11: 10 mL

## 2015-02-11 MED ORDER — FENTANYL CITRATE (PF) 100 MCG/2ML IJ SOLN
INTRAMUSCULAR | Status: AC
  Filled 2015-02-11: qty 2

## 2015-02-11 MED ORDER — SODIUM CHLORIDE 0.9 % IR SOLN
Status: DC | PRN
  Administered 2015-02-11: 3000 mL

## 2015-02-11 MED ORDER — IBUPROFEN 600 MG PO TABS: 600.0000 mg | ORAL_TABLET | Freq: Four times a day (QID) | ORAL | Status: AC | PRN

## 2015-02-11 MED ORDER — LIDOCAINE HCL (CARDIAC) 20 MG/ML IV SOLN
INTRAVENOUS | Status: DC | PRN
  Administered 2015-02-11: 80 mg via INTRAVENOUS

## 2015-02-11 MED ORDER — PROPOFOL 10 MG/ML IV BOLUS
INTRAVENOUS | Status: AC
  Filled 2015-02-11: qty 20

## 2015-02-11 MED ORDER — FENTANYL CITRATE (PF) 100 MCG/2ML IJ SOLN
INTRAMUSCULAR | Status: DC | PRN
  Administered 2015-02-11: 100 ug via INTRAVENOUS

## 2015-02-11 MED ORDER — DOCUSATE SODIUM 100 MG PO CAPS: 100.0000 mg | ORAL_CAPSULE | Freq: Two times a day (BID) | ORAL | Status: DC | PRN

## 2015-02-11 MED ORDER — ONDANSETRON HCL 4 MG/2ML IJ SOLN
INTRAMUSCULAR | Status: DC | PRN
  Administered 2015-02-11: 4 mg via INTRAVENOUS

## 2015-02-11 MED ORDER — DEXAMETHASONE SODIUM PHOSPHATE 10 MG/ML IJ SOLN
INTRAMUSCULAR | Status: DC | PRN
  Administered 2015-02-11: 10 mg via INTRAVENOUS

## 2015-02-11 MED ORDER — LACTATED RINGERS IV SOLN
INTRAVENOUS | Status: DC
  Administered 2015-02-11 (×2): via INTRAVENOUS

## 2015-02-11 MED ORDER — SCOPOLAMINE 1 MG/3DAYS TD PT72
MEDICATED_PATCH | TRANSDERMAL | Status: AC
  Filled 2015-02-11: qty 1

## 2015-02-11 MED ORDER — ONDANSETRON HCL 4 MG/2ML IJ SOLN: 4.0000 mg | Freq: Once | INTRAMUSCULAR | Status: DC | PRN

## 2015-02-11 MED ORDER — FENTANYL CITRATE (PF) 100 MCG/2ML IJ SOLN
100.0000 ug | Freq: Once | INTRAMUSCULAR | Status: AC
  Administered 2015-02-11: 50 ug via INTRAVENOUS

## 2015-02-11 MED ORDER — ONDANSETRON HCL 4 MG/2ML IJ SOLN
INTRAMUSCULAR | Status: AC
  Filled 2015-02-11: qty 2

## 2015-02-11 MED ORDER — SCOPOLAMINE 1 MG/3DAYS TD PT72: 1.0000 | MEDICATED_PATCH | Freq: Once | TRANSDERMAL | Status: DC

## 2015-02-11 SURGICAL SUPPLY — 14 items
CATH ROBINSON RED A/P 16FR (CATHETERS) ×3 IMPLANT
CLOTH BEACON ORANGE TIMEOUT ST (SAFETY) ×3 IMPLANT
CONTAINER PREFILL 10% NBF 60ML (FORM) ×6 IMPLANT
GLOVE BIOGEL PI IND STRL 6.5 (GLOVE) ×1 IMPLANT
GLOVE BIOGEL PI IND STRL 7.0 (GLOVE) ×1 IMPLANT
GLOVE BIOGEL PI INDICATOR 6.5 (GLOVE) ×2
GLOVE BIOGEL PI INDICATOR 7.0 (GLOVE) ×2
GLOVE SURG SS PI 6.0 STRL IVOR (GLOVE) ×3 IMPLANT
GOWN STRL REUS W/TWL LRG LVL3 (GOWN DISPOSABLE) ×6 IMPLANT
PACK VAGINAL MINOR WOMEN LF (CUSTOM PROCEDURE TRAY) ×3 IMPLANT
PAD OB MATERNITY 4.3X12.25 (PERSONAL CARE ITEMS) ×3 IMPLANT
SET GENESYS HTA PROCERVA (MISCELLANEOUS) ×3 IMPLANT
TOWEL OR 17X24 6PK STRL BLUE (TOWEL DISPOSABLE) ×6 IMPLANT
WATER STERILE IRR 1000ML POUR (IV SOLUTION) ×3 IMPLANT

## 2015-02-11 NOTE — Anesthesia Preprocedure Evaluation (Addendum)
Anesthesia Evaluation  Patient identified by MRN, date of birth, ID band Patient awake    Reviewed: Allergy & Precautions, H&P , NPO status , Patient's Chart, lab work & pertinent test results, reviewed documented beta blocker date and time   History of Anesthesia Complications (+) PONV and history of anesthetic complications  Airway Mallampati: II  TM Distance: >3 FB Neck ROM: full    Dental no notable dental hx. (+) Teeth Intact   Pulmonary Current Smoker,    Pulmonary exam normal        Cardiovascular negative cardio ROS Normal cardiovascular exam     Neuro/Psych negative neurological ROS  negative psych ROS   GI/Hepatic negative GI ROS, Neg liver ROS,   Endo/Other  negative endocrine ROS  Renal/GU negative Renal ROS     Musculoskeletal   Abdominal Normal abdominal exam  (+)   Peds  Hematology negative hematology ROS (+)   Anesthesia Other Findings   Reproductive/Obstetrics negative OB ROS                            Anesthesia Physical Anesthesia Plan  ASA: II  Anesthesia Plan: General   Post-op Pain Management:    Induction: Intravenous  Airway Management Planned: LMA  Additional Equipment:   Intra-op Plan:   Post-operative Plan:   Informed Consent: I have reviewed the patients History and Physical, chart, labs and discussed the procedure including the risks, benefits and alternatives for the proposed anesthesia with the patient or authorized representative who has indicated his/her understanding and acceptance.     Plan Discussed with: CRNA and Surgeon  Anesthesia Plan Comments:         Anesthesia Quick Evaluation

## 2015-02-11 NOTE — Transfer of Care (Signed)
Immediate Anesthesia Transfer of Care Note  Patient: Madison Watts  Procedure(s) Performed: Procedure(s): HYSTEROSCOPY WITH HYDROTHERMAL ABLATION (N/A)  Patient Location: PACU  Anesthesia Type:General  Level of Consciousness: awake  Airway & Oxygen Therapy: Patient Spontanous Breathing  Post-op Assessment: Report given to PACU RN  Post vital signs: stable  Filed Vitals:   02/11/15 1240  BP: 159/96  Pulse: 78  Temp: 36.9 C  Resp: 18    Complications: No apparent anesthesia complications

## 2015-02-11 NOTE — Discharge Instructions (Signed)
Endometrial Ablation °Endometrial ablation removes the lining of the uterus (endometrium). It is usually a same-day, outpatient treatment. Ablation helps avoid major surgery, such as surgery to remove the cervix and uterus (hysterectomy). After endometrial ablation, you will have little or no menstrual bleeding and may not be able to have children. However, if you are premenopausal, you will need to use a reliable method of birth control following the procedure because of the small chance that pregnancy can occur. °There are different reasons to have this procedure. These reasons include: °· Heavy periods. °· Bleeding that is causing anemia. °· Irregular bleeding. °· Bleeding fibroids on the lining inside the uterus if they are smaller than 3 centimeters. °This procedure may not be possible for you if:  °· You want to have children in the future.   °· You have severe cramps with your menstrual period.   °· You have precancerous or cancerous cells in your uterus.   °· You were recently pregnant.   °· You have gone through menopause.   °· You have had major surgery on your uterus, resulting in thinning of the uterine wall. Surgeries may include: °¨ The removal of one or more uterine fibroids (myomectomy). °¨ A cesarean section with a classic (vertical) incision on your uterus. Ask your health care provider what type of cesarean you had. Sometimes the scar on your skin is different than the scar on your uterus. °Even if you have had surgery on your uterus, certain types of ablation may still be safe for you. Talk with your health care provider. °LET YOUR HEALTH CARE PROVIDER KNOW ABOUT: °· Any allergies you have. °· All medicines you are taking, including vitamins, herbs, eye drops, creams, and over-the-counter medicines. °· Previous problems you or members of your family have had with the use of anesthetics. °· Any blood disorders you have. °· Previous surgeries you have had. °· Medical conditions you have. °RISKS AND  COMPLICATIONS  °Generally, this is a safe procedure. However, as with any procedure, complications can occur. Possible complications include: °· Perforation of the uterus. °· Bleeding. °· Infection of the uterus, bladder, or vagina. °· Injury to surrounding organs. °· An air bubble to the lung (air embolus). °· Pregnancy following the procedure. °· Failure of the procedure to help the problem, requiring hysterectomy. °· Decreased ability to diagnose cancer in the lining of the uterus. °BEFORE THE PROCEDURE °· The lining of the uterus must be tested to make sure there is no pre-cancerous or cancer cells present. °· An ultrasound may be performed to look at the size of the uterus and to check for abnormalities. °· Medicines may be given to thin the lining of the uterus. °PROCEDURE  °During the procedure, your health care provider will use a tool called a resectoscope to help see inside your uterus. There are different ways to remove the lining of your uterus.  °· Radiofrequency - This method uses a radiofrequency-alternating electric current to remove the lining of the uterus. °· Cryotherapy - This method uses extreme cold to freeze the lining of the uterus. °· Heated-Free Liquid - This method uses heated salt (saline) solution to remove the lining of the uterus. °· Microwave - This method uses high-energy microwaves to heat up the lining of the uterus to remove it. °· Thermal balloon - This method involves inserting a catheter with a balloon tip into the uterus. The balloon tip is filled with heated fluid to remove the lining of the uterus. °AFTER THE PROCEDURE  °After your procedure, do   not have sexual intercourse or insert anything into your vagina until permitted by your health care provider. After the procedure, you may experience:  Cramps.  Vaginal discharge.  Frequent urination.   This information is not intended to replace advice given to you by your health care provider. Make sure you discuss any  questions you have with your health care provider.   Document Released: 12/26/2003 Document Revised: 11/06/2014 Document Reviewed: 07/19/2012 Elsevier Interactive Patient Education 2016 [place]: D&C / D&E The following instructions have been prepared to help you care for yourself upon your return home.   Personal hygiene:  Use sanitary pads for vaginal drainage, not tampons.  Shower the day after your procedure.  NO tub baths, pools or Jacuzzis for 2-3 weeks.  Wipe front to back after using the bathroom.  Activity and limitations:  Do NOT drive or operate any equipment for 24 hours. The effects of anesthesia are still present and drowsiness may result.  Do NOT rest in bed all day.  Walking is encouraged.  Walk up and down stairs slowly.  You may resume your normal activity in one to two days or as indicated by your physician.  Sexual activity: NO intercourse for at least 2 weeks after the procedure, or as indicated by your physician.  Diet: Eat a light meal as desired this evening. You may resume your usual diet tomorrow.  Return to work: You may resume your work activities in one to two days or as indicated by your doctor.  What to expect after your surgery: Expect to have vaginal bleeding/discharge for 2-3 days and spotting for up to 10 days. It is not unusual to have soreness for up to 1-2 weeks. You may have a slight burning sensation when you urinate for the first day. Mild cramps may continue for a couple of days. You may have a regular period in 2-6 weeks.  Call your doctor for any of the following:  Excessive vaginal bleeding, saturating and changing one pad every hour.  Inability to urinate 6 hours after discharge from hospital.  Pain not relieved by pain medication.  Fever of 100.4 F or greater.  Unusual vaginal discharge or odor.   Call for an appointment:    Patients signature: ______________________  Nurses signature  ________________________  Support person's signature_______________________

## 2015-02-11 NOTE — Op Note (Signed)
PREOPERATIVE DIAGNOSIS:  Abnormal uterine bleeding POSTOPERATIVE DIAGNOSIS: The same PROCEDURE: Hysteroscopy, Dilation and Curettage,  Hydrothermal Endometrial Ablation SURGEON:  Dr. Mora Bellman  INDICATIONS: 54 y.o. [id number] here for scheduled surgery for abnormal uterine bleeding. Risks of surgery were discussed with the patient including but not limited to: bleeding; infection which may require antibiotics; injury to uterus leading to risk of injury to surrounding intraperitoneal organs, burn injury to vagina or other organs, need for additional procedures including laparoscopy or laparotomy, inability to complete ablation due to uterine or mechanical anomaly, and other postoperative/anesthesia complications.  Patient was informed that there is a high likelihood of success of controlling her symptoms; however about 5% of patients may require further intervention.  Written informed consent was obtained.    FINDINGS:  A 10 week size uterus.  Diffuse proliferative endometrium.  Normal ostia bilaterally.  ANESTHESIA:   General, paracervical block. INTRAVENOUS FLUIDS:  1500 ml of LR ESTIMATED BLOOD LOSS:  Less than 20 ml SPECIMENS: endometrial currettings COMPLICATIONS:  None immediate.  PROCEDURE DETAILS:  The patient was then taken to the operating room where general anesthesia was administered and was found to be adequate.  After an adequate timeout was performed, she was placed in the dorsal lithotomy position and examined; then prepped and draped in the sterile manner.   Her bladder was catheterized for clear, yellow urine. A speculum was then placed in the patient's vagina and a single tooth tenaculum was applied to the anterior lip of the cervix.   A paracervical block using 30 ml of 0.5% Marcaine was administered.  The cervix was sounded to 8.5 cm and dilated manually with metal dilators to accommodate the hydrothermal ablation hysteroscopic apparatus.  Once the cervix was dilated, a sharp  curettage was then performed to obtain a moderate amount of endometrial curettings.  The hysteroscope was inserted under direct visualization using normal saline as a suspension medium.  The uterine cavity was carefully examined, both ostia were recognized, and diffusely proliferative endometrium was noted.   The hydrothermal ablation was then carried out as per protocol.   Complete ablation of the endometrium was observed and the hysteroscope was removed under direct visualization.  No complications were observed.  The tenaculum was removed from the anterior lip of the cervix, and the vaginal speculum was removed after noting good hemostasis.  The patient tolerated the procedure well and was taken to the recovery area awake, extubated and in stable condition.  The patient will be discharged to home as per PACU criteria.  Routine postoperative instructions given.  She was prescribed Percocet, Ibuprofen and Colace.

## 2015-02-11 NOTE — Anesthesia Procedure Notes (Signed)
Procedure Name: LMA Insertion Date/Time: 02/11/2015 2:27 PM Performed by: Casimer Lanius A Pre-anesthesia Checklist: Patient being monitored, Patient identified, Emergency Drugs available and Suction available Patient Re-evaluated:Patient Re-evaluated prior to inductionOxygen Delivery Method: Circle system utilized Preoxygenation: Pre-oxygenation with 100% oxygen Intubation Type: IV induction and Inhalational induction Ventilation: Mask ventilation without difficulty LMA: LMA inserted LMA Size: 4.0 Number of attempts: 1 Dental Injury: Teeth and Oropharynx as per pre-operative assessment

## 2015-02-11 NOTE — Anesthesia Postprocedure Evaluation (Signed)
Anesthesia Post Note  Patient: Madison Watts  Procedure(s) Performed: Procedure(s) (LRB): HYSTEROSCOPY WITH HYDROTHERMAL ABLATION (N/A)  Patient location during evaluation: PACU Anesthesia Type: General Level of consciousness: awake and alert Pain management: pain level controlled Vital Signs Assessment: post-procedure vital signs reviewed and stable Respiratory status: spontaneous breathing, nonlabored ventilation and respiratory function stable Cardiovascular status: blood pressure returned to baseline and stable Postop Assessment: no signs of nausea or vomiting and adequate PO intake Anesthetic complications: no    Last Vitals:  Filed Vitals:   02/11/15 1629 02/11/15 1630  BP:    Pulse: 84 88  Temp:    Resp: 14 17    Last Pain:  Filed Vitals:   02/11/15 1638  PainSc: 2                  Shriyans Kuenzi A.

## 2015-02-11 NOTE — Anesthesia Postprocedure Evaluation (Signed)
Anesthesia Post Note  Patient: Madison Watts  Procedure(s) Performed: Procedure(s) (LRB): HYSTEROSCOPY WITH HYDROTHERMAL ABLATION (N/A)  Patient location during evaluation: PACU Anesthesia Type: General Level of consciousness: awake and alert Pain management: pain level controlled Vital Signs Assessment: post-procedure vital signs reviewed and stable Respiratory status: spontaneous breathing, nonlabored ventilation and respiratory function stable Cardiovascular status: blood pressure returned to baseline and stable Postop Assessment: no signs of nausea or vomiting Anesthetic complications: no    Last Vitals:  Filed Vitals:   02/11/15 1530 02/11/15 1545  BP: 174/92 168/94  Pulse: 85 83  Temp:    Resp: 22 12    Last Pain:  Filed Vitals:   02/11/15 1551  PainSc: 6                  [name] A.

## 2015-02-12 ENCOUNTER — Encounter (HOSPITAL_COMMUNITY): Payer: Self-pay | Admitting: Obstetrics and Gynecology

## 2015-02-20 ENCOUNTER — Telehealth: Payer: Self-pay | Admitting: *Deleted

## 2015-02-20 NOTE — Telephone Encounter (Signed)
Called LM pt missed her appointment 02/20/15 at 1:30 pm for pre-visit for colonoscopy on 03/06/15 and will need to call us back to reschedule appointments-adm

## 2015-03-05 ENCOUNTER — Encounter: Payer: Self-pay | Admitting: Obstetrics and Gynecology

## 2015-03-05 ENCOUNTER — Ambulatory Visit (INDEPENDENT_AMBULATORY_CARE_PROVIDER_SITE_OTHER): Payer: BLUE CROSS/BLUE SHIELD | Admitting: Obstetrics and Gynecology

## 2015-03-05 VITALS — BP 160/86 | HR 80 | Temp 98.2°F | Ht 69.0 in | Wt 214.0 lb

## 2015-03-05 DIAGNOSIS — Z9889 Other specified postprocedural states: Secondary | ICD-10-CM
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
## 2015-03-06 ENCOUNTER — Encounter: Payer: BLUE CROSS/BLUE SHIELD | Admitting: Internal Medicine

## 2015-03-20 ENCOUNTER — Ambulatory Visit (INDEPENDENT_AMBULATORY_CARE_PROVIDER_SITE_OTHER)
Admission: RE | Admit: 2015-03-20 | Discharge: 2015-03-20 | Disposition: A | Discharge status: Home / Self Care | Payer: BLUE CROSS/BLUE SHIELD | Source: Ambulatory Visit | Attending: Family Medicine | Admitting: Family Medicine

## 2015-03-20 DIAGNOSIS — R918 Other nonspecific abnormal finding of lung field: Secondary | ICD-10-CM

## 2015-04-03 ENCOUNTER — Other Ambulatory Visit: Payer: Self-pay | Admitting: *Deleted

## 2015-04-03 DIAGNOSIS — R918 Other nonspecific abnormal finding of lung field: Secondary | ICD-10-CM

## 2016-04-02 ENCOUNTER — Inpatient Hospital Stay: Admission: RE | Admit: 2016-04-02 | Payer: BLUE CROSS/BLUE SHIELD | Source: Ambulatory Visit

## 2016-06-08 ENCOUNTER — Telehealth: Payer: Self-pay | Admitting: *Deleted

## 2016-06-08 DIAGNOSIS — R918 Other nonspecific abnormal finding of lung field: Secondary | ICD-10-CM

## 2016-06-08 NOTE — Telephone Encounter (Signed)
-----   Message from Lucretia Kern, DO sent at 06/08/2016  2:30 PM EDT ----- Please call pt. 1 year CT for pulm nodule due in January appear to not yet be done? Can you re-order/schedule for her if not done elsewhere. Thanks. ----- Message ----- From: SYSTEM Sent: 06/05/2016  12:05 AM To: Lucretia Kern, DO

## 2016-06-08 NOTE — Telephone Encounter (Signed)
I called the pt and informed her of the message below and she is aware the order was entered and someone will call with appt info.

## 2016-06-18 ENCOUNTER — Inpatient Hospital Stay: Admission: RE | Admit: 2016-06-18 | Payer: BLUE CROSS/BLUE SHIELD | Source: Ambulatory Visit

## 2016-07-05 ENCOUNTER — Ambulatory Visit (INDEPENDENT_AMBULATORY_CARE_PROVIDER_SITE_OTHER)
Admission: RE | Admit: 2016-07-05 | Discharge: 2016-07-05 | Disposition: A | Discharge status: Home / Self Care | Payer: BLUE CROSS/BLUE SHIELD | Source: Ambulatory Visit | Attending: Family Medicine | Admitting: Family Medicine

## 2016-07-05 DIAGNOSIS — R918 Other nonspecific abnormal finding of lung field: Secondary | ICD-10-CM | POA: Diagnosis not present

## 2016-11-18 ENCOUNTER — Encounter: Payer: Self-pay | Admitting: Family Medicine

## 2017-03-10 ENCOUNTER — Encounter: Payer: Self-pay | Admitting: Family Medicine

## 2017-08-25 IMAGING — CT CT CHEST W/O CM
2 of 3 series · 15 of 36 positions shown, 18 images · non-contrast
Comparison: CT of the chest performed 03/20/2015

CLINICAL DATA: Follow-up bilateral pulmonary nodules. Subsequent
encounter.

EXAM:
CT CHEST WITHOUT CONTRAST
TECHNIQUE: Multidetector CT imaging of the chest was performed following the
standard protocol without IV contrast.

[Series 2: thorax · axial · 0.70mm/px · z∈[-394,-154]mm · 12 of 142 slices shown, 15 images]
[im 11/142  mediastinal]
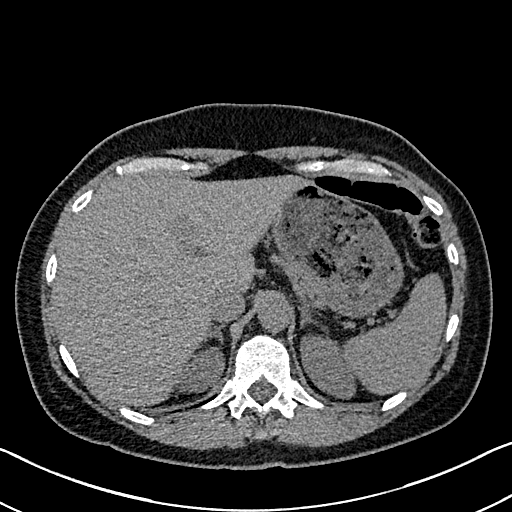
[im 11/142  lung]
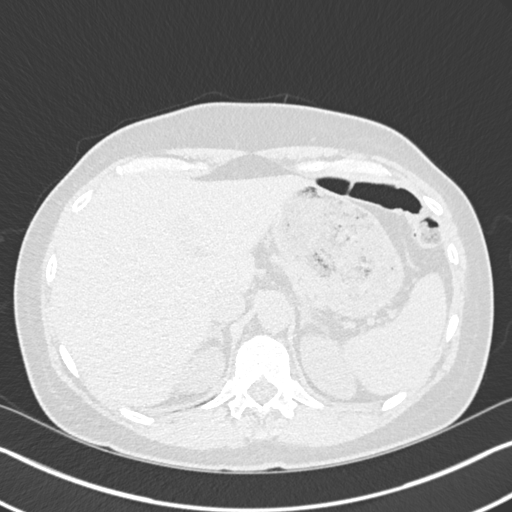
[im 21/142  lung]
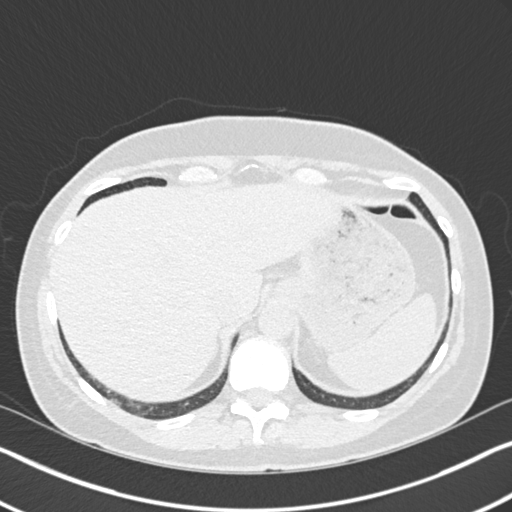
[im 32/142  lung]
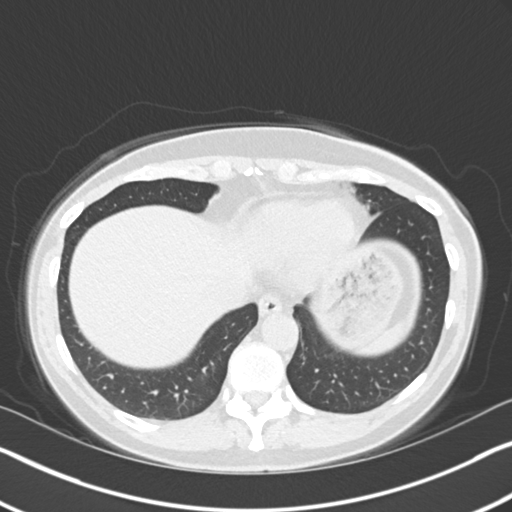
[im 42/142  lung]
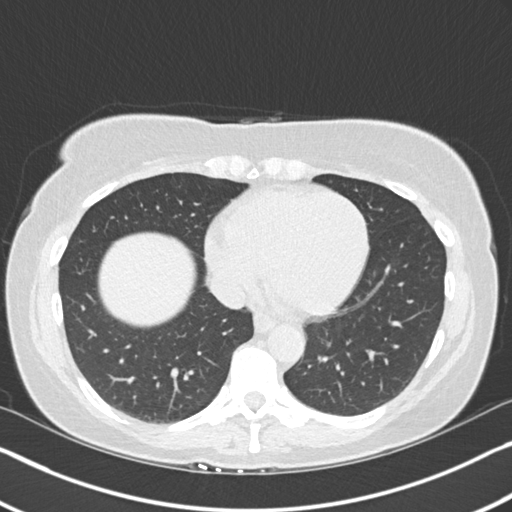
[im 53/142  mediastinal]
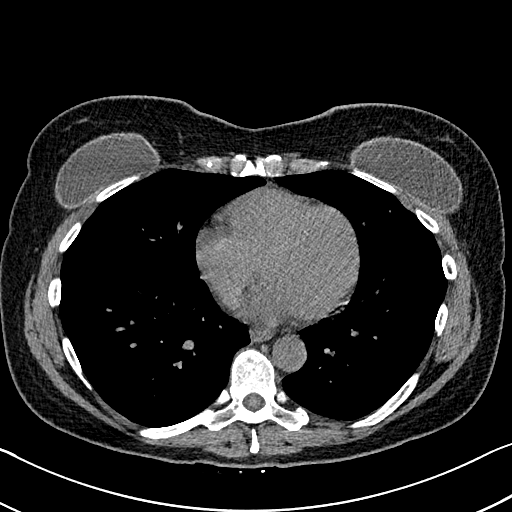
[im 53/142  lung]
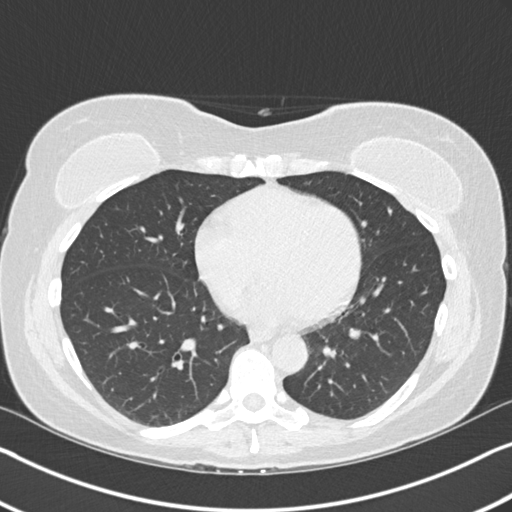
[im 63/142  lung]
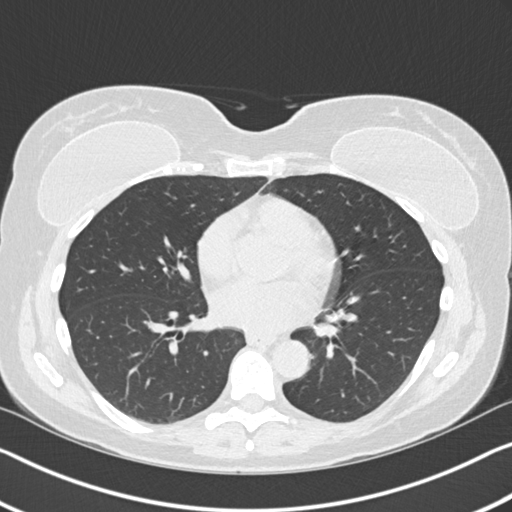
[im 79/142  lung]
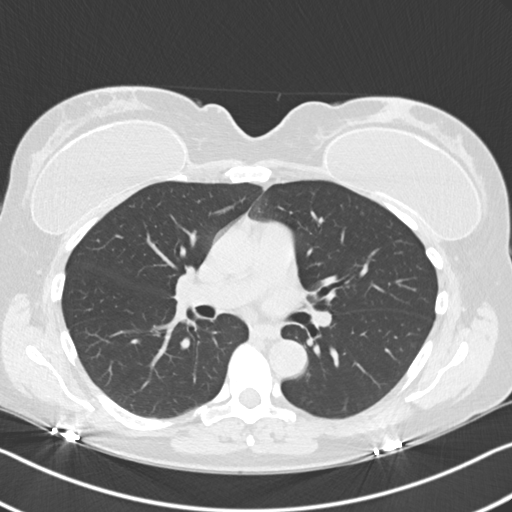
[im 89/142  lung]
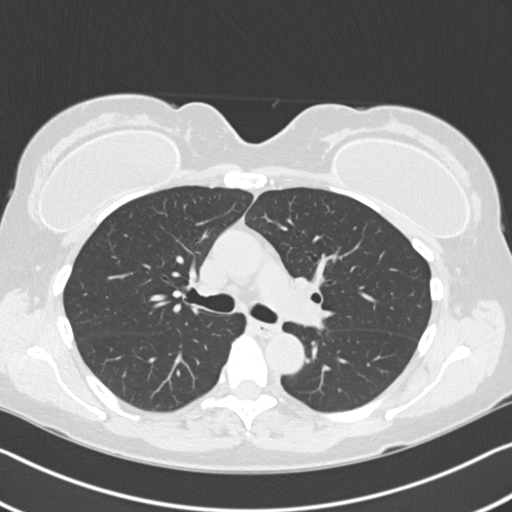
[im 100/142  mediastinal]
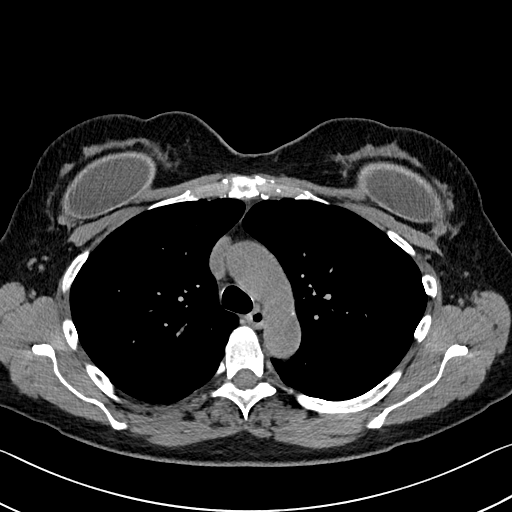
[im 100/142  lung]
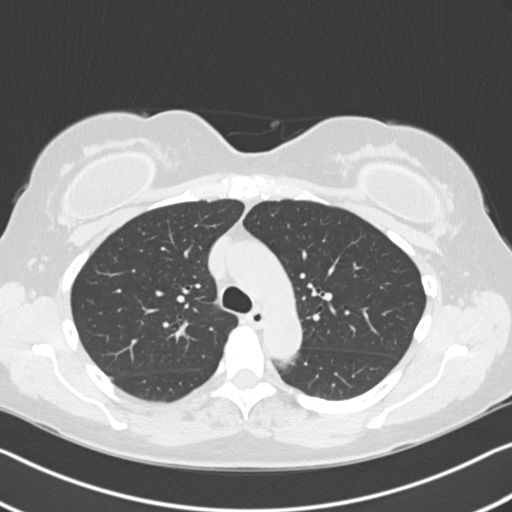
[im 110/142  lung]
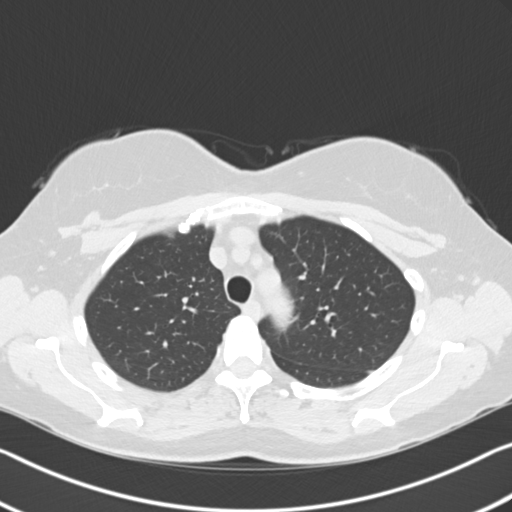
[im 121/142  lung]
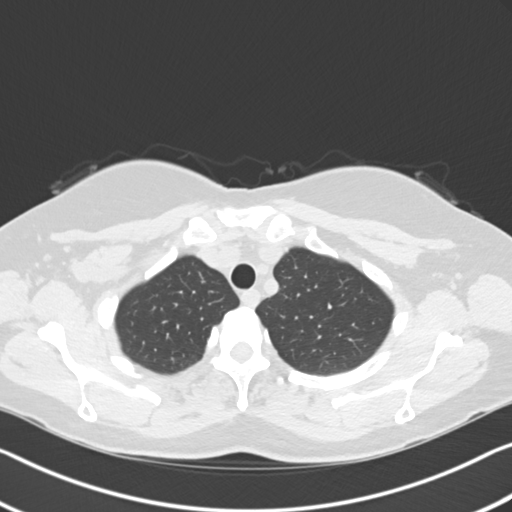
[im 131/142  lung]
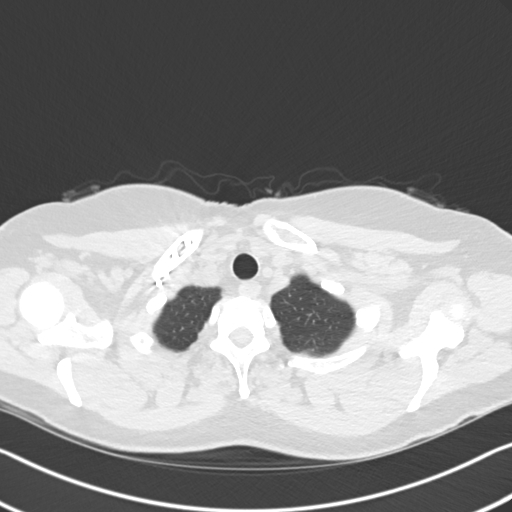

[Series 5: coronal · coronal · 0.59mm/px · 3 of 126 slices shown]
[im 26/126  lung]
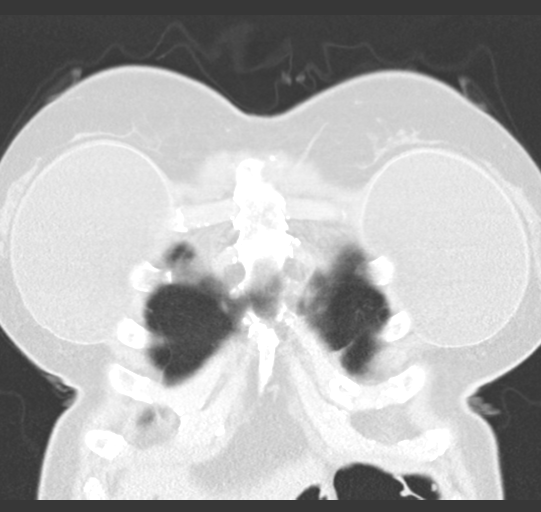
[im 51/126  lung]
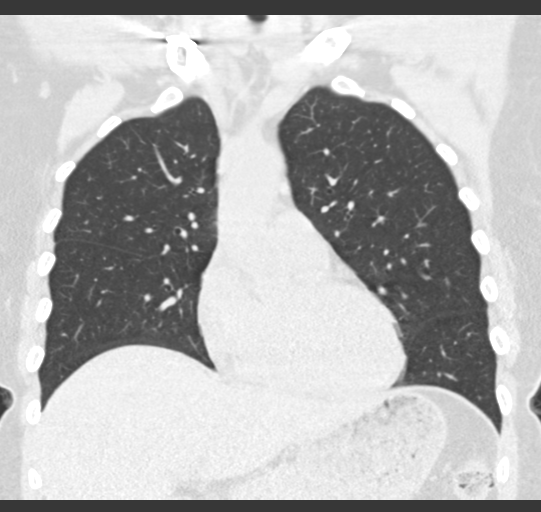
[im 76/126  lung]
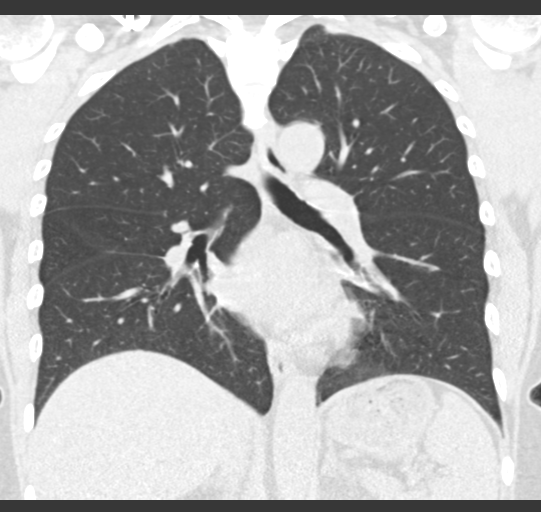

[15 of 36 positions shown; findings below may reference images not displayed]

FINDINGS: Cardiovascular: The heart is normal in size. The thoracic aorta is
grossly unremarkable. The great vessels are within normal limits. No
significant calcific atherosclerotic disease is seen.

Mediastinum/Nodes: The mediastinum is unremarkable in appearance. No
mediastinal lymphadenopathy is seen. No pericardial effusion is
identified. The visualized portions of the thyroid gland are
unremarkable. No axillary lymphadenopathy is appreciated.

Lungs/Pleura: Scattered small bilateral pulmonary nodules are again
seen, measuring up to 5 mm in size. These are predominantly
subpleural and perifissural in nature, and appear stable from 4468.
No pleural effusion or pneumothorax is seen. No dominant mass is
identified.

Upper Abdomen: The visualized portions of the liver and spleen are
unremarkable. The visualized portions of the pancreas, adrenal
glands and kidneys are within normal limits.

Musculoskeletal: No acute osseous abnormalities are identified.
Postoperative change is noted along the right clavicle. The
visualized musculature is unremarkable in appearance. Bilateral
breast implants are grossly unremarkable in appearance.
IMPRESSION: 1. No acute abnormality seen within the chest.
2. Scattered small bilateral pulmonary nodules are stable in
appearance, measuring up to 5 mm in size. Given stability over the
past year, these are most likely benign. No additional follow-up is
needed.
